# Patient Record
Sex: Male | Born: 1985
Health system: Southern US, Community
[De-identification: ages and names within clinical notes are randomized; demographics above are authoritative.]

## PROBLEM LIST (undated history)

## (undated) DIAGNOSIS — G4733 Obstructive sleep apnea (adult) (pediatric): Secondary | ICD-10-CM

## (undated) DIAGNOSIS — S83289A Other tear of lateral meniscus, current injury, unspecified knee, initial encounter: Secondary | ICD-10-CM

## (undated) DIAGNOSIS — M1990 Unspecified osteoarthritis, unspecified site: Secondary | ICD-10-CM

## (undated) DIAGNOSIS — K219 Gastro-esophageal reflux disease without esophagitis: Secondary | ICD-10-CM

## (undated) HISTORY — PX: LAPAROSCOPIC GASTRIC BAND REMOVAL WITH LAPAROSCOPIC GASTRIC SLEEVE RESECTION: SHX6498

---

## 2008-07-29 HISTORY — PX: WISDOM TOOTH EXTRACTION: SHX21

## 2015-02-27 ENCOUNTER — Encounter (HOSPITAL_BASED_OUTPATIENT_CLINIC_OR_DEPARTMENT_OTHER): Payer: Self-pay | Admitting: *Deleted

## 2015-02-27 ENCOUNTER — Emergency Department (HOSPITAL_BASED_OUTPATIENT_CLINIC_OR_DEPARTMENT_OTHER)
Admission: EM | Admit: 2015-02-27 | Discharge: 2015-02-27 | Disposition: A | Discharge status: Home / Self Care | Payer: Medicaid Other | Attending: Emergency Medicine | Admitting: Emergency Medicine

## 2015-02-27 DIAGNOSIS — H9201 Otalgia, right ear: Secondary | ICD-10-CM | POA: Diagnosis present

## 2015-02-27 DIAGNOSIS — R51 Headache: Secondary | ICD-10-CM | POA: Insufficient documentation

## 2015-02-27 DIAGNOSIS — H6091 Unspecified otitis externa, right ear: Secondary | ICD-10-CM | POA: Diagnosis not present

## 2015-02-27 MED ORDER — OXYCODONE-ACETAMINOPHEN 5-325 MG PO TABS
1.0000 | ORAL_TABLET | Freq: Once | ORAL | Status: AC
  Administered 2015-02-27: 1 via ORAL
  Filled 2015-02-27: qty 1

## 2015-02-27 MED ORDER — CIPROFLOXACIN-DEXAMETHASONE 0.3-0.1 % OT SUSP
4.0000 [drp] | Freq: Once | OTIC | Status: AC
  Administered 2015-02-27: 4 [drp] via OTIC
  Filled 2015-02-27: qty 7.5

## 2015-02-27 MED ORDER — IBUPROFEN 600 MG PO TABS: 600.0000 mg | ORAL_TABLET | Freq: Four times a day (QID) | ORAL | Status: DC | PRN

## 2015-02-27 NOTE — Discharge Instructions (Signed)
Apply ear drops, 4 drops twice a day for 7 days. Follow up as needed.    Otitis Externa Otitis externa is a bacterial or fungal infection of the outer ear canal. This is the area from the eardrum to the outside of the ear. Otitis externa is sometimes called "swimmer's ear." CAUSES  Possible causes of infection include:  Swimming in dirty water.  Moisture remaining in the ear after swimming or bathing.  Mild injury (trauma) to the ear.  Objects stuck in the ear (foreign body).  Cuts or scrapes (abrasions) on the outside of the ear. SIGNS AND SYMPTOMS  The first symptom of infection is often itching in the ear canal. Later signs and symptoms may include swelling and redness of the ear canal, ear pain, and yellowish-white fluid (pus) coming from the ear. The ear pain may be worse when pulling on the earlobe. DIAGNOSIS  Your health care provider will perform a physical exam. A sample of fluid may be taken from the ear and examined for bacteria or fungi. TREATMENT  Antibiotic ear drops are often given for 10 to 14 days. Treatment may also include pain medicine or corticosteroids to reduce itching and swelling. HOME CARE INSTRUCTIONS   Apply antibiotic ear drops to the ear canal as prescribed by your health care provider.  Take medicines only as directed by your health care provider.  If you have diabetes, follow any additional treatment instructions from your health care provider.  Keep all follow-up visits as directed by your health care provider. PREVENTION   Keep your ear dry. Use the corner of a towel to absorb water out of the ear canal after swimming or bathing.  Avoid scratching or putting objects inside your ear. This can damage the ear canal or remove the protective wax that lines the canal. This makes it easier for bacteria and fungi to grow.  Avoid swimming in lakes, polluted water, or poorly chlorinated pools.  You may use ear drops made of rubbing alcohol and vinegar  after swimming. Combine equal parts of white vinegar and alcohol in a bottle. Put 3 or 4 drops into each ear after swimming. SEEK MEDICAL CARE IF:   You have a fever.  Your ear is still red, swollen, painful, or draining pus after 3 days.  Your redness, swelling, or pain gets worse.  You have a severe headache.  You have redness, swelling, pain, or tenderness in the area behind your ear. MAKE SURE YOU:   Understand these instructions.  Will watch your condition.  Will get help right away if you are not doing well or get worse. Document Released: 07/15/2005 Document Revised: 11/29/2013 Document Reviewed: 08/01/2011 [facility] Patient Information 2015 [place], [place]. This information is not intended to replace advice given to you by your health care provider. Make sure you discuss any questions you have with your health care provider.

## 2015-02-27 NOTE — ED Notes (Signed)
PA at bedside.

## 2015-02-27 NOTE — ED Provider Notes (Signed)
CSN: [id number]     Arrival date & time 02/27/15  1805 History   First MD Initiated Contact with Patient 02/27/15 1854     Chief Complaint  Patient presents with  . Otalgia     (Consider location/radiation/quality/duration/timing/severity/associated sxs/prior Treatment) HPI Antonio Conner is a 29 y.o. male with no medical problems, presents to ED with right ear pain. Pt states pain started 4 days ago. Reports swimming in the lake a week ago. Denies any drainage. States ear pain worsened with palpation of the ear. States some decreased hearing. Denies fever, chills, malaise. No other complaints. No other URI symptoms.     History reviewed. No pertinent past medical history. History reviewed. No pertinent past surgical history. No family history on file. History  Substance Use Topics  . Smoking status: Never Smoker   . Smokeless tobacco: Not on file  . Alcohol Use: No    Review of Systems  Constitutional: Negative for fever and chills.  HENT: Positive for ear pain. Negative for congestion, ear discharge, facial swelling, hearing loss, mouth sores, sore throat and tinnitus.   Neurological: Positive for headaches.      Allergies  Influenza vaccines  Home Medications   Prior to Admission medications   Not on File   BP 129/86 mmHg  Pulse 82  Temp(Src) 98.1 F (36.7 C) (Oral)  Resp 20  Ht  (1.778 m)  Wt 460 lb (208.655 kg)  BMI 66.00 kg/m2  SpO2 99% Physical Exam  Constitutional: He appears well-developed and well-nourished. No distress.  HENT:  Nose: Nose normal.  Mouth/Throat: Oropharynx is clear and moist. No oropharyngeal exudate.  Swelling noted over helix and tragus of right ear. TTP. Ear canal swelling, drainage noted. No ttp over mastoid  Cardiovascular: Normal rate, regular rhythm and normal heart sounds.   Pulmonary/Chest: Effort normal and breath sounds normal. No respiratory distress. He has no wheezes. He has no rales.  Nursing note and vitals  reviewed.   ED Course  Procedures (including critical care time) Labs Review Labs Reviewed - No data to display  Imaging Review No results found.   EKG Interpretation None      MDM   Final diagnoses:  Otitis externa, right   Pt with right ear pain and swelling. Exam consistent with otitis externa. Wick inserted. Ciprodex given. Home with ciprodex, ibuprofen, follow up as needed. Return precautions dicussed.   Filed Vitals:   02/27/15 1809 02/27/15 2016  BP: 129/86 150/78  Pulse: 82 88  Temp: 98.1 F (36.7 C)   TempSrc: Oral   Resp: 20   Height:  (1.778 m)   Weight: 460 lb (208.655 kg)   SpO2: 99% 100%     Jaynie Crumble, PA-C 02/28/15 0020  Jerelyn Scott, MD 02/28/15 [phone]

## 2015-02-27 NOTE — ED Notes (Signed)
Was swimming [place] a lake last week and ocean a few days ago. Here for pain and swelling to his right ear.

## 2015-03-30 HISTORY — PX: LAPAROSCOPIC GASTRIC BAND REMOVAL WITH LAPAROSCOPIC GASTRIC SLEEVE RESECTION: SHX6498

## 2017-05-26 DIAGNOSIS — E119 Type 2 diabetes mellitus without complications: Secondary | ICD-10-CM | POA: Diagnosis not present

## 2017-05-26 DIAGNOSIS — R05 Cough: Secondary | ICD-10-CM | POA: Diagnosis not present

## 2017-05-26 DIAGNOSIS — I10 Essential (primary) hypertension: Secondary | ICD-10-CM | POA: Diagnosis not present

## 2017-08-29 DIAGNOSIS — J029 Acute pharyngitis, unspecified: Secondary | ICD-10-CM | POA: Diagnosis not present

## 2018-06-28 ENCOUNTER — Other Ambulatory Visit: Payer: Self-pay

## 2018-06-28 ENCOUNTER — Emergency Department (HOSPITAL_BASED_OUTPATIENT_CLINIC_OR_DEPARTMENT_OTHER)
Admission: EM | Admit: 2018-06-28 | Discharge: 2018-06-28 | Disposition: A | Discharge status: Home / Self Care | Payer: No Typology Code available for payment source | Attending: Emergency Medicine | Admitting: Emergency Medicine

## 2018-06-28 ENCOUNTER — Encounter (HOSPITAL_BASED_OUTPATIENT_CLINIC_OR_DEPARTMENT_OTHER): Payer: Self-pay | Admitting: *Deleted

## 2018-06-28 DIAGNOSIS — Z7721 Contact with and (suspected) exposure to potentially hazardous body fluids: Secondary | ICD-10-CM | POA: Diagnosis present

## 2018-06-28 DIAGNOSIS — Z9884 Bariatric surgery status: Secondary | ICD-10-CM | POA: Insufficient documentation

## 2018-06-28 DIAGNOSIS — Y990 Civilian activity done for income or pay: Secondary | ICD-10-CM | POA: Diagnosis not present

## 2018-06-28 LAB — HCV COMMENT:

## 2018-06-28 LAB — COMPREHENSIVE METABOLIC PANEL
ALT: 11 U/L (ref 0–44)
ANION GAP: 7 (ref 5–15)
AST: 18 U/L (ref 15–41)
Albumin: 4.2 g/dL (ref 3.5–5.0)
Alkaline Phosphatase: 66 U/L (ref 38–126)
BUN: 13 mg/dL (ref 6–20)
CHLORIDE: 106 mmol/L (ref 98–111)
CO2: 26 mmol/L (ref 22–32)
Calcium: 8.8 mg/dL — ABNORMAL LOW (ref 8.9–10.3)
Creatinine, Ser: 1.03 mg/dL (ref 0.61–1.24)
GFR calc Af Amer: >60 mL/min (ref >60)
Glucose, Bld: 104 mg/dL — ABNORMAL HIGH (ref 70–99)
Potassium: 3.6 mmol/L (ref 3.5–5.1)
SODIUM: 139 mmol/L (ref 135–145)
Total Bilirubin: 0.1 mg/dL — ABNORMAL LOW (ref 0.3–1.2)
Total Protein: 7.4 g/dL (ref 6.5–8.1)

## 2018-06-28 LAB — HEPATITIS B SURFACE ANTIGEN: HEP B S AG: NEGATIVE

## 2018-06-28 LAB — HEPATITIS C ANTIBODY (REFLEX)

## 2018-06-28 LAB — RAPID HIV SCREEN (HIV 1/2 AB+AG)
HIV 1/2 ANTIBODIES: NONREACTIVE
HIV-1 P24 ANTIGEN - HIV24: NONREACTIVE

## 2018-06-28 MED ORDER — ELVITEG-COBIC-EMTRICIT-TENOFAF 150-150-200-10 MG PREPACK
5.0000 | ORAL_TABLET | Freq: Once | ORAL | Status: AC
  Administered 2018-06-28: 5 via ORAL
  Filled 2018-06-28: qty 5

## 2018-06-28 MED ORDER — ELVITEG-COBIC-EMTRICIT-TENOFAF 150-150-200-10 MG PO TABS: 1.0000 | ORAL_TABLET | Freq: Every day | ORAL | 0 refills | Status: DC

## 2018-06-28 MED ORDER — ELVITEG-COBIC-EMTRICIT-TENOFAF 150-150-200-10 MG PO TABS
ORAL_TABLET | ORAL | Status: AC
  Filled 2018-06-28: qty 5
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
## 2019-05-27 NOTE — Patient Instructions (Addendum)
DUE TO COVID-19 ONLY ONE VISITOR IS ALLOWED TO COME WITH YOU AND STAY IN THE WAITING ROOM ONLY DURING PRE OP AND PROCEDURE DAY OF SURGERY. THE 1 VISITOR MAY VISIT WITH YOU AFTER SURGERY IN YOUR PRIVATE ROOM DURING VISITING HOURS ONLY!  YOU NEED TO HAVE A COVID 19 TEST ON___Friday 10/30/2020____ @__1015  am_____, THIS TEST MUST BE DONE BEFORE SURGERY, COME  [address], [place] , [address].  ([place]) ONCE YOUR COVID TEST IS COMPLETED, PLEASE BEGIN THE QUARANTINE INSTRUCTIONS AS OUTLINED IN YOUR HANDOUT.                Antonio Conner    Your procedure is scheduled on: Monday 05/31/2019   Report to [facility] Main  Entrance    Report to admitting at   135 PM     Call this number if you have problems the morning of surgery [phone]    Remember: Do not eat food  :After Midnight.   NO SOLID FOOD AFTER MIDNIGHT THE NIGHT PRIOR TO SURGERY. NOTHING BY MOUTH EXCEPT CLEAR LIQUIDS UNTIL  1235 pm .     PLEASE FINISH ENSURE DRINK PER SURGEON ORDER  WHICH NEEDS TO BE COMPLETED AT   1235 pm.   CLEAR LIQUID DIET   Foods Allowed                                                                     Foods Excluded  Coffee and tea, regular and decaf                             liquids that you cannot  Plain Jell-O any favor except red or purple                                           see through such as: Fruit ices (not with fruit pulp)                                     milk, soups, orange juice  Iced Popsicles                                    All solid food Carbonated beverages, regular and diet                                    Cranberry, grape and apple juices Sports drinks like Gatorade Lightly seasoned clear broth or consume(fat free) Sugar, honey syrup  Sample Menu Breakfast                                Lunch  Supper Cranberry juice                    Beef broth                            Chicken  broth Jell-O                                     Grape juice                           Apple juice Coffee or tea                        Jell-O                                      Popsicle                                                Coffee or tea                        Coffee or tea  _____________________________________________________________________      BRUSH YOUR TEETH MORNING OF SURGERY AND RINSE YOUR MOUTH OUT, NO CHEWING GUM CANDY OR MINTS.     Take these medicines the morning of surgery with A SIP OF WATER: none                                 You may not have any metal on your body including hair pins and              piercings  Do not wear jewelry, make-up, lotions, powders or perfumes, deodorant           .              Men may shave face and neck.   Do not bring valuables to the hospital.  IS NOT             RESPONSIBLE   FOR VALUABLES.  Contacts, dentures or bridgework may not be worn into surgery.  Leave suitcase in the car. After surgery it may be brought to your room.     Patients discharged the day of surgery will not be allowed to drive home. IF YOU ARE HAVING SURGERY AND GOING HOME THE SAME DAY, YOU MUST HAVE AN ADULT TO DRIVE YOU HOME AND BE WITH YOU FOR 24 HOURS. YOU MAY GO HOME BY TAXI OR UBER OR ORTHERWISE, BUT AN ADULT MUST ACCOMPANY YOU HOME AND STAY WITH YOU FOR 24 HOURS.  Name and phone number of your driver: fiancee'-[name]   Cell-[phone]               Please read over the following fact sheets you were given: _____________________________________________________________________             [facility] - Preparing for Surgery Before surgery, you can play an important role.  Because skin is not sterile, your skin needs to  be as free of germs as possible.  You can reduce the number of germs on your skin by washing with CHG (chlorahexidine gluconate) soap before surgery.  CHG is an antiseptic cleaner which kills germs and bonds with  the skin to continue killing germs even after washing. Please DO NOT use if you have an allergy to CHG or antibacterial soaps.  If your skin becomes reddened/irritated stop using the CHG and inform your nurse when you arrive at Short Stay. Do not shave (including legs and underarms) for at least 48 hours prior to the first CHG shower.  You may shave your face/neck. Please follow these instructions carefully:  1.  Shower with CHG Soap the night before surgery and the  morning of Surgery.  2.  If you choose to wash your hair, wash your hair first as usual with your  normal  shampoo.  3.  After you shampoo, rinse your hair and body thoroughly to remove the  shampoo.                           4.  Use CHG as you would any other liquid soap.  You can apply chg directly  to the skin and wash                       Gently with a scrungie or clean washcloth.  5.  Apply the CHG Soap to your body ONLY FROM THE NECK DOWN.   Do not use on face/ open                           Wound or open sores. Avoid contact with eyes, ears mouth and genitals (private parts).                       Wash face,  Genitals (private parts) with your normal soap.             6.  Wash thoroughly, paying special attention to the area where your surgery  will be performed.  7.  Thoroughly rinse your body with warm water from the neck down.  8.  DO NOT shower/wash with your normal soap after using and rinsing off  the CHG Soap.                9.  Pat yourself dry with a clean towel.            10.  Wear clean pajamas.            11.  Place clean sheets on your bed the night of your first shower and do not  sleep with pets. Day of Surgery : Do not apply any lotions/deodorants the morning of surgery.  Please wear clean clothes to the hospital/surgery center.  FAILURE TO FOLLOW THESE INSTRUCTIONS MAY RESULT IN THE CANCELLATION OF YOUR SURGERY PATIENT SIGNATURE_________________________________  NURSE  SIGNATURE__________________________________  ________________________________________________________________________   Adam Phenix  An incentive spirometer is a tool that can help keep your lungs clear and active. This tool measures how well you are filling your lungs with each breath. Taking long deep breaths may help reverse or decrease the chance of developing breathing (pulmonary) problems (especially infection) following:  A long period of time when you are unable to move or be active. BEFORE THE PROCEDURE   If the spirometer includes an indicator to show your  best effort, your nurse or respiratory therapist will set it to a desired goal.  If possible, sit up straight or lean slightly forward. Try not to slouch.  Hold the incentive spirometer in an upright position. INSTRUCTIONS FOR USE  1. Sit on the edge of your bed if possible, or sit up as far as you can in bed or on a chair. 2. Hold the incentive spirometer in an upright position. 3. Breathe out normally. 4. Place the mouthpiece in your mouth and seal your lips tightly around it. 5. Breathe in slowly and as deeply as possible, raising the piston or the ball toward the top of the column. 6. Hold your breath for 3-5 seconds or for as long as possible. Allow the piston or ball to fall to the bottom of the column. 7. Remove the mouthpiece from your mouth and breathe out normally. 8. Rest for a few seconds and repeat Steps 1 through 7 at least 10 times every 1-2 hours when you are awake. Take your time and take a few normal breaths between deep breaths. 9. The spirometer may include an indicator to show your best effort. Use the indicator as a goal to work toward during each repetition. 10. After each set of 10 deep breaths, practice coughing to be sure your lungs are clear. If you have an incision (the cut made at the time of surgery), support your incision when coughing by placing a pillow or rolled up towels firmly  against it. Once you are able to get out of bed, walk around indoors and cough well. You may stop using the incentive spirometer when instructed by your caregiver.  RISKS AND COMPLICATIONS  Take your time so you do not get dizzy or light-headed.  If you are in pain, you may need to take or ask for pain medication before doing incentive spirometry. It is harder to take a deep breath if you are having pain. AFTER USE  Rest and breathe slowly and easily.  It can be helpful to keep track of a log of your progress. Your caregiver can provide you with a simple table to help with this. If you are using the spirometer at home, follow these instructions: Antonio Conner IF:   You are having difficultly using the spirometer.  You have trouble using the spirometer as often as instructed.  Your pain medication is not giving enough relief while using the spirometer.  You develop fever of 100.5 F (38.1 C) or higher. SEEK IMMEDIATE MEDICAL CARE IF:   You cough up bloody sputum that had not been present before.  You develop fever of 102 F (38.9 C) or greater.  You develop worsening pain at or near the incision site. MAKE SURE YOU:   Understand these instructions.  Will watch your condition.  Will get help right away if you are not doing well or get worse. Document Released: 11/25/2006 Document Revised: 10/07/2011 Document Reviewed: 01/26/2007 [facility] Patient Information 2014 [place], [place].   ________________________________________________________________________

## 2019-05-28 ENCOUNTER — Encounter (HOSPITAL_COMMUNITY)
Admission: RE | Admit: 2019-05-28 | Discharge: 2019-05-28 | Disposition: A | Discharge status: Home / Self Care | Payer: No Typology Code available for payment source | Source: Ambulatory Visit | Attending: Orthopedic Surgery | Admitting: Orthopedic Surgery

## 2019-05-28 ENCOUNTER — Other Ambulatory Visit: Payer: Self-pay

## 2019-05-28 ENCOUNTER — Other Ambulatory Visit (HOSPITAL_COMMUNITY)
Admission: RE | Admit: 2019-05-28 | Discharge: 2019-05-28 | Disposition: A | Discharge status: Home / Self Care | Payer: Worker's Compensation | Source: Ambulatory Visit | Attending: Orthopedic Surgery | Admitting: Orthopedic Surgery

## 2019-05-28 ENCOUNTER — Encounter (HOSPITAL_COMMUNITY): Payer: Self-pay

## 2019-05-28 DIAGNOSIS — Z20828 Contact with and (suspected) exposure to other viral communicable diseases: Secondary | ICD-10-CM | POA: Diagnosis not present

## 2019-05-28 DIAGNOSIS — S83281A Other tear of lateral meniscus, current injury, right knee, initial encounter: Secondary | ICD-10-CM | POA: Insufficient documentation

## 2019-05-28 DIAGNOSIS — Z01812 Encounter for preprocedural laboratory examination: Secondary | ICD-10-CM | POA: Insufficient documentation

## 2019-05-28 HISTORY — DX: Other tear of lateral meniscus, current injury, unspecified knee, initial encounter: S83.289A

## 2019-05-28 LAB — CBC WITH DIFFERENTIAL/PLATELET
Abs Immature Granulocytes: 0.02 10*3/uL (ref 0.00–0.07)
Basophils Absolute: 0 10*3/uL (ref 0.0–0.1)
Basophils Relative: 0 %
Eosinophils Absolute: 0.3 10*3/uL (ref 0.0–0.5)
Eosinophils Relative: 4 %
HCT: 46.2 % (ref 39.0–52.0)
Hemoglobin: 14.6 g/dL (ref 13.0–17.0)
Immature Granulocytes: 0 %
Lymphocytes Relative: 32 %
Lymphs Abs: 2.9 10*3/uL (ref 0.7–4.0)
MCH: 27.3 pg (ref 26.0–34.0)
MCHC: 31.6 g/dL (ref 30.0–36.0)
MCV: 86.5 fL (ref 80.0–100.0)
Monocytes Absolute: 0.8 10*3/uL (ref 0.1–1.0)
Monocytes Relative: 9 %
Neutro Abs: 5 10*3/uL (ref 1.7–7.7)
Neutrophils Relative %: 55 %
Platelets: 261 10*3/uL (ref 150–400)
RBC: 5.34 MIL/uL (ref 4.22–5.81)
RDW: 15.5 % (ref 11.5–15.5)
WBC: 9.1 10*3/uL (ref 4.0–10.5)
nRBC: 0 % (ref 0.0–0.2)

## 2019-05-28 LAB — COMPREHENSIVE METABOLIC PANEL
ALT: 15 U/L (ref 0–44)
AST: 20 U/L (ref 15–41)
Albumin: 4 g/dL (ref 3.5–5.0)
Alkaline Phosphatase: 70 U/L (ref 38–126)
Anion gap: 8 (ref 5–15)
BUN: 14 mg/dL (ref 6–20)
CO2: 26 mmol/L (ref 22–32)
Calcium: 9.1 mg/dL (ref 8.9–10.3)
Chloride: 105 mmol/L (ref 98–111)
Creatinine, Ser: 1.02 mg/dL (ref 0.61–1.24)
GFR calc Af Amer: >60 mL/min (ref >60)
GFR calc non Af Amer: >60 mL/min (ref >60)
Glucose, Bld: 125 mg/dL — ABNORMAL HIGH (ref 70–99)
Potassium: 4.2 mmol/L (ref 3.5–5.1)
Sodium: 139 mmol/L (ref 135–145)
Total Bilirubin: 0.8 mg/dL (ref 0.3–1.2)
Total Protein: 7.9 g/dL (ref 6.5–8.1)

## 2019-05-28 NOTE — Progress Notes (Signed)
Patient called and informed of time change for surgery. Patient instructed to arrive at Admitting at [facility] 1040 am on Monday 05/31/2019. Patient instructed that he can have clear liquids from midnight up until he  Drinks the Ensure drink  at 0940 am and then nothing else until after surgery. Patient verbalized understanding.

## 2019-05-28 NOTE — Progress Notes (Addendum)
PCP - Dr. Iona Beard Osei-Bonsu Cardiologist - saw 04/05/2015 for work-up to get Gastric sleeve surgery- Dr. Abran Richard   Surgeon for Gastric sleeve-Dr. Ralph Leyden   All in Epic and care everywhere  Chest x-ray - no EKG - no Stress Test - no ECHO - 04/21/2015- in care everywhere [facility]-had to have before gastric sleeve surgery Cardiac Cath - no  Sleep Study - had one 5 years ago-used CPAP-lost insurance so has not used CPAP since then CPAP - quit using 5 years ago  Fasting Blood Sugar - no Checks Blood Sugar _____ times a day  Blood Thinner Instructions:no Aspirin Instructions:no Last Dose:  Anesthesia review:  Chart given to [place], [place] to review.  Patient denies shortness of breath, fever, cough and chest pain at PAT appointment   Patient verbalized understanding of instructions that were given to them at the PAT appointment. Patient was also instructed that they will need to review over the PAT instructions again at home before surgery.

## 2019-05-29 LAB — NOVEL CORONAVIRUS, NAA (HOSP ORDER, SEND-OUT TO REF LAB; TAT 18-24 HRS): SARS-CoV-2, NAA: NOT DETECTED

## 2019-05-30 MED ORDER — DEXTROSE 5 % IV SOLN
3.0000 g | INTRAVENOUS | Status: AC
  Administered 2019-05-31: 3 g via INTRAVENOUS
  Filled 2019-05-30: qty 3

## 2019-05-31 ENCOUNTER — Ambulatory Visit (HOSPITAL_COMMUNITY)
Admission: RE | Admit: 2019-05-31 | Discharge: 2019-05-31 | Disposition: A | Discharge status: Home / Self Care | Payer: No Typology Code available for payment source | Source: Ambulatory Visit | Attending: Orthopedic Surgery | Admitting: Orthopedic Surgery

## 2019-05-31 ENCOUNTER — Ambulatory Visit (HOSPITAL_COMMUNITY): Payer: No Typology Code available for payment source | Admitting: Anesthesiology

## 2019-05-31 ENCOUNTER — Encounter (HOSPITAL_COMMUNITY)
Admission: RE | Disposition: A | Discharge status: Home / Self Care | Payer: Self-pay | Source: Ambulatory Visit | Attending: Orthopedic Surgery

## 2019-05-31 ENCOUNTER — Other Ambulatory Visit: Payer: Self-pay

## 2019-05-31 ENCOUNTER — Encounter (HOSPITAL_COMMUNITY): Payer: Self-pay | Admitting: *Deleted

## 2019-05-31 DIAGNOSIS — W010XXA Fall on same level from slipping, tripping and stumbling without subsequent striking against object, initial encounter: Secondary | ICD-10-CM | POA: Diagnosis not present

## 2019-05-31 DIAGNOSIS — S83281A Other tear of lateral meniscus, current injury, right knee, initial encounter: Secondary | ICD-10-CM | POA: Insufficient documentation

## 2019-05-31 DIAGNOSIS — G4730 Sleep apnea, unspecified: Secondary | ICD-10-CM | POA: Insufficient documentation

## 2019-05-31 DIAGNOSIS — Y990 Civilian activity done for income or pay: Secondary | ICD-10-CM | POA: Diagnosis not present

## 2019-05-31 DIAGNOSIS — Z9884 Bariatric surgery status: Secondary | ICD-10-CM | POA: Insufficient documentation

## 2019-05-31 HISTORY — PX: KNEE ARTHROSCOPY: SHX127

## 2019-05-31 SURGERY — ARTHROSCOPY, KNEE
Anesthesia: General | Site: Knee | Laterality: Right

## 2019-05-31 MED ORDER — HYDROMORPHONE HCL 1 MG/ML IJ SOLN
INTRAMUSCULAR | Status: AC
  Filled 2019-05-31: qty 1

## 2019-05-31 MED ORDER — LIDOCAINE 2% (20 MG/ML) 5 ML SYRINGE
INTRAMUSCULAR | Status: AC
  Filled 2019-05-31: qty 5

## 2019-05-31 MED ORDER — ACETAMINOPHEN 500 MG PO TABS
1000.0000 mg | ORAL_TABLET | Freq: Once | ORAL | Status: AC
  Administered 2019-05-31: 1000 mg via ORAL
  Filled 2019-05-31: qty 2

## 2019-05-31 MED ORDER — LACTATED RINGERS IR SOLN
Status: DC | PRN
  Administered 2019-05-31: 12000 mL

## 2019-05-31 MED ORDER — BUPIVACAINE-EPINEPHRINE 0.25% -1:200000 IJ SOLN
INTRAMUSCULAR | Status: DC | PRN
  Administered 2019-05-31: 20 mL

## 2019-05-31 MED ORDER — OXYCODONE HCL 5 MG/5ML PO SOLN: 5.0000 mg | Freq: Once | ORAL | Status: DC | PRN

## 2019-05-31 MED ORDER — PROMETHAZINE HCL 25 MG/ML IJ SOLN: 6.2500 mg | INTRAMUSCULAR | Status: DC | PRN

## 2019-05-31 MED ORDER — BUPIVACAINE HCL (PF) 0.25 % IJ SOLN
INTRAMUSCULAR | Status: AC
  Filled 2019-05-31: qty 30

## 2019-05-31 MED ORDER — MEPERIDINE HCL 50 MG/ML IJ SOLN: 6.2500 mg | INTRAMUSCULAR | Status: DC | PRN

## 2019-05-31 MED ORDER — ONDANSETRON HCL 4 MG/2ML IJ SOLN
INTRAMUSCULAR | Status: DC | PRN
  Administered 2019-05-31: 4 mg via INTRAVENOUS

## 2019-05-31 MED ORDER — FENTANYL CITRATE (PF) 100 MCG/2ML IJ SOLN
INTRAMUSCULAR | Status: DC | PRN
  Administered 2019-05-31 (×2): 100 ug via INTRAVENOUS

## 2019-05-31 MED ORDER — OXYCODONE HCL 5 MG PO TABS: 5.0000 mg | ORAL_TABLET | Freq: Once | ORAL | Status: DC | PRN

## 2019-05-31 MED ORDER — MIDAZOLAM HCL 2 MG/2ML IJ SOLN
INTRAMUSCULAR | Status: AC
  Filled 2019-05-31: qty 2

## 2019-05-31 MED ORDER — ROCURONIUM BROMIDE 10 MG/ML (PF) SYRINGE
PREFILLED_SYRINGE | INTRAVENOUS | Status: DC | PRN
  Administered 2019-05-31: 40 mg via INTRAVENOUS

## 2019-05-31 MED ORDER — FENTANYL CITRATE (PF) 100 MCG/2ML IJ SOLN
INTRAMUSCULAR | Status: AC
  Filled 2019-05-31: qty 2

## 2019-05-31 MED ORDER — HYDROMORPHONE HCL 1 MG/ML IJ SOLN: 0.2500 mg | INTRAMUSCULAR | Status: DC | PRN

## 2019-05-31 MED ORDER — PROPOFOL 10 MG/ML IV BOLUS
INTRAVENOUS | Status: DC | PRN
  Administered 2019-05-31: 200 mg via INTRAVENOUS

## 2019-05-31 MED ORDER — 0.9 % SODIUM CHLORIDE (POUR BTL) OPTIME
TOPICAL | Status: DC | PRN
  Administered 2019-05-31: 1000 mL

## 2019-05-31 MED ORDER — DEXAMETHASONE SODIUM PHOSPHATE 10 MG/ML IJ SOLN
INTRAMUSCULAR | Status: AC
  Filled 2019-05-31: qty 1

## 2019-05-31 MED ORDER — SUCCINYLCHOLINE CHLORIDE 200 MG/10ML IV SOSY
PREFILLED_SYRINGE | INTRAVENOUS | Status: DC | PRN
  Administered 2019-05-31: 180 mg via INTRAVENOUS

## 2019-05-31 MED ORDER — LIDOCAINE HCL (CARDIAC) PF 100 MG/5ML IV SOSY
PREFILLED_SYRINGE | INTRAVENOUS | Status: DC | PRN
  Administered 2019-05-31: 100 mg via INTRAVENOUS

## 2019-05-31 MED ORDER — HYDROCODONE-ACETAMINOPHEN 5-325 MG PO TABS: 1.0000 | ORAL_TABLET | Freq: Four times a day (QID) | ORAL | 0 refills | Status: DC | PRN

## 2019-05-31 MED ORDER — KETOROLAC TROMETHAMINE 30 MG/ML IJ SOLN
30.0000 mg | Freq: Once | INTRAMUSCULAR | Status: AC | PRN
  Administered 2019-05-31: 30 mg via INTRAVENOUS

## 2019-05-31 MED ORDER — PROPOFOL 10 MG/ML IV BOLUS
INTRAVENOUS | Status: AC
  Filled 2019-05-31: qty 40

## 2019-05-31 MED ORDER — MIDAZOLAM HCL 2 MG/2ML IJ SOLN
INTRAMUSCULAR | Status: DC | PRN
  Administered 2019-05-31: 2 mg via INTRAVENOUS

## 2019-05-31 MED ORDER — DEXAMETHASONE SODIUM PHOSPHATE 10 MG/ML IJ SOLN
INTRAMUSCULAR | Status: DC | PRN
  Administered 2019-05-31: 7 mg via INTRAVENOUS

## 2019-05-31 MED ORDER — LACTATED RINGERS IV SOLN
INTRAVENOUS | Status: DC
  Administered 2019-05-31 (×2): via INTRAVENOUS

## 2019-05-31 MED ORDER — METHOCARBAMOL 500 MG PO TABS: 500.0000 mg | ORAL_TABLET | Freq: Four times a day (QID) | ORAL | 1 refills | Status: DC

## 2019-05-31 MED ORDER — CHLORHEXIDINE GLUCONATE 4 % EX LIQD: 60.0000 mL | Freq: Once | CUTANEOUS | Status: DC

## 2019-05-31 MED ORDER — ONDANSETRON HCL 4 MG/2ML IJ SOLN
INTRAMUSCULAR | Status: AC
  Filled 2019-05-31: qty 2

## 2019-05-31 MED ORDER — BUPIVACAINE-EPINEPHRINE 0.25% -1:200000 IJ SOLN
INTRAMUSCULAR | Status: AC
  Filled 2019-05-31: qty 1

## 2019-05-31 MED ORDER — SUGAMMADEX SODIUM 200 MG/2ML IV SOLN
INTRAVENOUS | Status: DC | PRN
  Administered 2019-05-31: 370 mg via INTRAVENOUS

## 2019-05-31 MED ORDER — KETOROLAC TROMETHAMINE 30 MG/ML IJ SOLN
INTRAMUSCULAR | Status: AC
  Filled 2019-05-31: qty 1

## 2019-05-31 SURGICAL SUPPLY — 30 items
BNDG ELASTIC 6X5.8 VLCR STR LF (GAUZE/BANDAGES/DRESSINGS) ×2 IMPLANT
COVER WAND RF STERILE (DRAPES) IMPLANT
DISSECTOR 4.0MM X 13CM (MISCELLANEOUS) ×2 IMPLANT
DRAPE U-SHAPE 47X51 STRL (DRAPES) ×2 IMPLANT
DRSG EMULSION OIL 3X3 NADH (GAUZE/BANDAGES/DRESSINGS) ×2 IMPLANT
DRSG PAD ABDOMINAL 8X10 ST (GAUZE/BANDAGES/DRESSINGS) ×2 IMPLANT
DURAPREP 26ML APPLICATOR (WOUND CARE) ×2 IMPLANT
GAUZE 4X4 16PLY RFD (DISPOSABLE) ×2 IMPLANT
GAUZE SPONGE 4X4 12PLY STRL (GAUZE/BANDAGES/DRESSINGS) ×2 IMPLANT
GLOVE BIO SURGEON STRL SZ8 (GLOVE) ×2 IMPLANT
GLOVE BIOGEL PI IND STRL 7.0 (GLOVE) ×1 IMPLANT
GLOVE BIOGEL PI IND STRL 8 (GLOVE) ×1 IMPLANT
GLOVE BIOGEL PI INDICATOR 7.0 (GLOVE) ×1
GLOVE BIOGEL PI INDICATOR 8 (GLOVE) ×1
GLOVE SURG SS PI 7.0 STRL IVOR (GLOVE) ×2 IMPLANT
GOWN STRL REUS W/TWL LRG LVL3 (GOWN DISPOSABLE) ×4 IMPLANT
KIT BASIN OR (CUSTOM PROCEDURE TRAY) ×2 IMPLANT
KIT TURNOVER KIT A (KITS) IMPLANT
MANIFOLD NEPTUNE II (INSTRUMENTS) ×2 IMPLANT
MARKER SKIN DUAL TIP RULER LAB (MISCELLANEOUS) ×2 IMPLANT
PACK ARTHROSCOPY WL (CUSTOM PROCEDURE TRAY) ×2 IMPLANT
PACK ICE MAXI GEL EZY WRAP (MISCELLANEOUS) ×6 IMPLANT
PADDING CAST COTTON 6X4 STRL (CAST SUPPLIES) ×4 IMPLANT
PORT APPOLLO RF 90DEGREE MULTI (SURGICAL WAND) ×2 IMPLANT
PROTECTOR NERVE ULNAR (MISCELLANEOUS) ×2 IMPLANT
SUT ETHILON 4 0 PS 2 18 (SUTURE) ×2 IMPLANT
TOWEL OR 17X26 10 PK STRL BLUE (TOWEL DISPOSABLE) ×2 IMPLANT
TUBING ARTHRO INFLOW-ONLY STRL (TUBING) ×2 IMPLANT
TUBING ARTHROSCOPY IRRIG 16FT (MISCELLANEOUS) ×2 IMPLANT
WRAP KNEE MAXI GEL POST OP (GAUZE/BANDAGES/DRESSINGS) ×2 IMPLANT

## 2019-05-31 NOTE — Op Note (Addendum)
Operative Report- KNEE ARTHROSCOPY  Preoperative diagnosis-  Right knee lateral meniscal tear  Postoperative diagnosis Right- knee lateral meniscal tear   Procedure- Right knee arthroscopy with lateral  meniscal debridement    Surgeon- Dione Plover. Jaimin Krupka, MD  Anesthesia-General  EBL-  Minimal  Complications- None  Condition- PACU - hemodynamically stable.  Brief clinical note- -Antonio Conner is a 33 y.o.  male with a several month history of Right knee pain and mechanical symptoms after a workplace injury. Exam and history suggested lateral  meniscal tear confirmed by MRI. The patient presents now for arthroscopy and debridement   Procedure in detail -       After successful administration of General anesthetic,the Right lower extremity is prepped and draped in the usual sterile fashion. Time out is performed by the surgical team. Standard superomedial and inferolateral portal sites are marked and incisions made with an 11 blade. The inflow cannula is passed through the superomedial portal and camera through the inferolateral portal and inflow is initiated. Arthroscopic visualization proceeds.      The undersurface of the patella and trochlea are visualized and there is mild chondromalacia but no chondral defects. The medial and lateral gutters are visualized and there are  no loose bodies. Flexion and valgus force is applied to the knee and the medial compartment is entered. A spinal needle is passed into the joint through the site marked for the inferomedial portal. A small incision is made and the dilator passed into the joint. The findings for the medial compartment are normal .     The intercondylar notch is visualized and the ACL appears normal . The lateral compartment is entered and the findings are unstable tear of lateral meniscus body and posterior horn . The tear is debrided to a stable base with baskets and a shaver and sealed off with the Arthrocare.  It is probed and found  to be stable. There were no articular cartilagel abnormalities     The joint is again inspected and there are no other tears, defects or loose bodies identified. The arthroscopic equipment is then removed from the inferior portals which are closed with interrupted 4-0 nylon. 20 ml of .25% Marcaine with epinephrine are injected through the inflow cannula and the cannula is then removed and the portal closed with nylon. The incisions are cleaned and dried and a bulky sterile dressing is applied. The patient is then awakened and transported to recovery in stable condition.   05/31/2019, 1:56 PM

## 2019-05-31 NOTE — Anesthesia Preprocedure Evaluation (Addendum)
Anesthesia Evaluation  Patient identified by MRN, date of birth, ID band Patient awake    Reviewed: Allergy & Precautions, NPO status , Patient's Chart, lab work & pertinent test results  Airway Mallampati: II  TM Distance: >3 FB Neck ROM: Full    Dental  (+) Teeth Intact, Dental Advisory Given   Pulmonary sleep apnea ,  Lost insurance about 5 years ago, at which time he stopped using CPAP   Pulmonary exam normal breath sounds clear to auscultation       Cardiovascular negative cardio ROS Normal cardiovascular exam Rhythm:Regular Rate:Normal  Normal cardiac w/u in 2016 prior to bariatric surgery   Neuro/Psych negative neurological ROS  negative psych ROS   GI/Hepatic Neg liver ROS, S/p bariatric surgery 2016   Endo/Other  Morbid obesityBMI 60  Renal/GU negative Renal ROS  negative genitourinary   Musculoskeletal negative musculoskeletal ROS (+)   Abdominal (+) + obese,   Peds negative pediatric ROS (+)  Hematology negative hematology ROS (+)   Anesthesia Other Findings   Reproductive/Obstetrics negative OB ROS                            Anesthesia Physical Anesthesia Plan  ASA: III  Anesthesia Plan: General   Post-op Pain Management:    Induction: Intravenous  PONV Risk Score and Plan: 2 and Ondansetron, Dexamethasone, Treatment may vary due to age or medical condition and Midazolam  Airway Management Planned: Oral ETT  Additional Equipment: None  Intra-op Plan:   Post-operative Plan: Extubation in OR  Informed Consent: I have reviewed the patients History and Physical, chart, labs and discussed the procedure including the risks, benefits and alternatives for the proposed anesthesia with the patient or authorized representative who has indicated his/her understanding and acceptance.     Dental advisory given  Plan Discussed with: CRNA  Anesthesia Plan Comments:         Anesthesia Quick Evaluation

## 2019-05-31 NOTE — Anesthesia Procedure Notes (Signed)
Procedure Name: Intubation Date/Time: 05/31/2019 12:36 PM Performed by: Raenette Rover, CRNA Pre-anesthesia Checklist: Patient identified, Emergency Drugs available, Suction available and Patient being monitored Patient Re-evaluated:Patient Re-evaluated prior to induction Oxygen Delivery Method: Circle system utilized Preoxygenation: Pre-oxygenation with 100% oxygen Induction Type: IV induction Ventilation: Mask ventilation without difficulty Laryngoscope Size: Miller and 3 Grade View: Grade I Tube type: Oral Tube size: 8.0 mm Number of attempts: 1 Airway Equipment and Method: Stylet and Patient positioned with wedge pillow Placement Confirmation: ETT inserted through vocal cords under direct vision,  positive ETCO2 and breath sounds checked- equal and bilateral Secured at: 24 cm Tube secured with: Tape Dental Injury: Teeth and Oropharynx as per pre-operative assessment

## 2019-05-31 NOTE — Anesthesia Postprocedure Evaluation (Signed)
Anesthesia Post Note  Patient: Antonio Conner  Procedure(s) Performed: Right knee arthroscopy with lateral meniscal debridement (Right Knee)     Patient location during evaluation: PACU Anesthesia Type: General Level of consciousness: awake and alert, oriented and patient cooperative Pain management: pain level controlled Vital Signs Assessment: post-procedure vital signs reviewed and stable Respiratory status: spontaneous breathing, nonlabored ventilation and respiratory function stable Cardiovascular status: blood pressure returned to baseline and stable Postop Assessment: no apparent nausea or vomiting Anesthetic complications: no    Last Vitals:  Vitals:   05/31/19 1400 05/31/19 1415  BP: (!) 144/100 (!) 135/108  Pulse: 88 88  Resp: 17 (!) 21  Temp:    SpO2: 98% 100%    Last Pain:  Vitals:   05/31/19 1400  TempSrc:   PainSc: [place]

## 2019-05-31 NOTE — Transfer of Care (Signed)
Immediate Anesthesia Transfer of Care Note  Patient: Antonio Conner  Procedure(s) Performed: Right knee arthroscopy with lateral meniscal debridement (Right Knee)  Patient Location: PACU  Anesthesia Type:General  Level of Consciousness: awake, alert , oriented and patient cooperative  Airway & Oxygen Therapy: Patient Spontanous Breathing and Patient connected to face mask oxygen  Post-op Assessment: Report given to RN and Post -op Vital signs reviewed and stable  Post vital signs: Reviewed and stable  Last Vitals:  Vitals Value Taken Time  BP 140/96 05/31/19 1354  Temp    Pulse 92 05/31/19 1357  Resp 18 05/31/19 1357  SpO2 98 % 05/31/19 1357  Vitals shown include unvalidated device data.  Last Pain:  Vitals:   05/31/19 1042  TempSrc: Oral      Patients Stated Pain Goal: 4 (64/33/29 5188)  Complications: No apparent anesthesia complications

## 2019-05-31 NOTE — H&P (Signed)
CC- Antonio Conner is a 33 y.o. male who presents with right  knee pain.  HPI- . Knee Pain: Patient presents with knee pain involving the  right knee. Onset of the symptoms was several months ago. Inciting event: injured while slipping at work. Current symptoms include giving out, pain located laterally, popping sensation, stiffness and swelling. Pain is aggravated by lateral movements, pivoting, rising after sitting and walking.  Patient has had no prior knee problems. Evaluation to date: MRI: abnormal lateral meniscal tear. Treatment to date: brace which is not very effective.  Past Medical History:  Diagnosis Date  . Lateral meniscus tear right knee    Past Surgical History:  Procedure Laterality Date  . LAPAROSCOPIC GASTRIC BAND REMOVAL WITH LAPAROSCOPIC GASTRIC SLEEVE RESECTION  03/2015  . WISDOM TOOTH EXTRACTION  2010    Prior to Admission medications   Medication Sig Start Date End Date Taking? Authorizing Provider  acetaminophen (TYLENOL) 325 MG tablet Take 975 mg by mouth every 6 (six) hours as needed for mild pain or headache.   Yes [provider]  Ascorbic Acid (VITAMIN C PO) Take 1 tablet by mouth daily.   Yes [provider]  Multiple Vitamin (MULTIVITAMIN WITH MINERALS) TABS tablet Take 1 tablet by mouth daily.   Yes [provider]  Omega-3 Fatty Acids (FISH OIL PO) Take 1 capsule by mouth daily.   Yes [provider]  elvitegravir-cobicistat-emtricitabine-tenofovir (GENVOYA) 150-150-200-10 MG TABS tablet Take 1 tablet by mouth daily with breakfast. Patient not taking: Reported on 05/28/2019 06/28/18   Caccavale, Sophia, PA-C  ibuprofen (ADVIL,MOTRIN) 600 MG tablet Take 1 tablet (600 mg total) by mouth every 6 (six) hours as needed. Patient not taking: Reported on 05/28/2019 02/27/15   Jeannett Senior, PA-C   Right Knee Exam antalgic gait, soft tissue tenderness over lateral joint line, effusion, negative drawer sign, collateral  ligaments intact  Physical Examination: General appearance - alert, well appearing, and in no distress Mental status - alert, oriented to person, place, and time Chest - clear to auscultation, no wheezes, rales or rhonchi, symmetric air entry Heart - normal rate, regular rhythm, normal S1, S2, no murmurs, rubs, clicks or gallops Abdomen - soft, nontender, nondistended, no masses or organomegaly Neurological - alert, oriented, normal speech, no focal findings or movement disorder noted   Asessment/Plan--- Right knee lateral meniscal tear- - Plan right knee arthroscopy with meniscal debridement. Procedure risks and potential comps discussed with patient who elects to proceed. Goals are decreased pain and increased function with a high likelihood of achieving both

## 2019-05-31 NOTE — Discharge Instructions (Addendum)
 Dr. Frank Aluisio [facility] [address] [place], [place] [address] [phone]   Arthroscopic Procedure, Knee An arthroscopic procedure can find what is wrong with your knee. PROCEDURE Arthroscopy is a surgical technique that allows your orthopedic surgeon to diagnose and treat your knee injury with accuracy. They will look into your knee through a small instrument. This is almost like a small (pencil sized) telescope. Because arthroscopy affects your knee less than open knee surgery, you can anticipate a more rapid recovery. Taking an active role by following your caregiver's instructions will help with rapid and complete recovery. Use crutches, rest, elevation, ice, and knee exercises as instructed. The length of recovery depends on various factors including type of injury, age, physical condition, medical conditions, and your rehabilitation. Your knee is the joint between the large bones (femur and tibia) in your leg. Cartilage covers these bone ends which are smooth and slippery and allow your knee to bend and move smoothly. Two menisci, thick, semi-lunar shaped pads of cartilage which form a rim inside the joint, help absorb shock and stabilize your knee. Ligaments bind the bones together and support your knee joint. Muscles move the joint, help support your knee, and take stress off the joint itself. Because of this all programs and physical therapy to rehabilitate an injured or repaired knee require rebuilding and strengthening your muscles. AFTER THE PROCEDURE  After the procedure, you will be moved to a recovery area until most of the effects of the medication have worn off. Your caregiver will discuss the test results with you.   Only take over-the-counter or prescription medicines for pain, discomfort, or fever as directed by your caregiver.  SEEK MEDICAL CARE IF:   You have increased bleeding from your wounds.   You see redness,  swelling, or have increasing pain in your wounds.   You have pus coming from your wound.   You have an oral temperature above 102 F (38.9 C).   You notice a bad smell coming from the wound or dressing.   You have severe pain with any motion of your knee.  SEEK IMMEDIATE MEDICAL CARE IF:   You develop a rash.   You have difficulty breathing.   You have any allergic problems.  FURTHER INSTRUCTIONS:   ICE to the affected knee every three hours for 30 minutes at a time and then as needed for pain and swelling.  Continue to use ice on the knee for pain and swelling from surgery. You may notice swelling that will progress down to the foot and ankle.  This is normal after surgery.  Elevate the leg when you are not up walking on it.    DIET You may resume your previous home diet once your are discharged from the hospital.  DRESSING / WOUND CARE / SHOWERING You may shower 3 days after surgery, but keep the wounds dry during showering.  You may use an occlusive plastic wrap (Press'n Seal for example), NO SOAKING/SUBMERGING IN THE BATHTUB.  If the bandage gets wet, change with a clean dry gauze.  If the incision gets wet, pat the wound dry with a clean towel. You may start showering two days after being discharged home but do not submerge the incisions under water.  Change dressing 48 hours after the procedure and then cover the small incisions with band aids until your follow up visit. Change the surgical dressings daily and reapply a dry dressing each time.   ACTIVITY   Walk with your walker as instructed. Use walker as long as suggested by your caregivers. Avoid periods of inactivity such as sitting longer than an hour when not asleep. This helps prevent blood clots.  You may resume a sexual relationship in one month or when given the OK by your doctor.  You may return to work once you are cleared by your doctor.  Do not drive a car for 6 weeks or until released by you surgeon.  Do not  drive while taking narcotics.  WEIGHT BEARING Weight bearing as tolerated with assist device (walker, cane, etc) as directed, use it as long as suggested by your surgeon or therapist, typically at least 4-6 weeks.  POSTOPERATIVE CONSTIPATION PROTOCOL Constipation - defined medically as fewer than three stools per week and severe constipation as less than one stool per week.  One of the most common issues patients have following surgery is constipation.  Even if you have a regular bowel pattern at home, your normal regimen is likely to be disrupted due to multiple reasons following surgery.  Combination of anesthesia, postoperative narcotics, change in appetite and fluid intake all can affect your bowels.  In order to avoid complications following surgery, here are some recommendations in order to help you during your recovery period.  Colace (docusate) - Pick up an over-the-counter form of Colace or another stool softener and take twice a day as long as you are requiring postoperative pain medications.  Take with a full glass of water daily.  If you experience loose stools or diarrhea, hold the colace until you stool forms back up.  If your symptoms do not get better within 1 week or if they get worse, check with your doctor.  Dulcolax (bisacodyl) - Pick up over-the-counter and take as directed by the product packaging as needed to assist with the movement of your bowels.  Take with a full glass of water.  Use this product as needed if not relieved by Colace only.   MiraLax (polyethylene glycol) - Pick up over-the-counter to have on hand.  MiraLax is a solution that will increase the amount of water in your bowels to assist with bowel movements.  Take as directed and can mix with a glass of water, juice, soda, coffee, or tea.  Take if you go more than two days without a movement. Do not use MiraLax more than once per day. Call your doctor if you are still constipated or irregular after using this  medication for 7 days in a row.  If you continue to have problems with postoperative constipation, please contact the office for further assistance and recommendations.  If you experience "the worst abdominal pain ever" or develop nausea or vomiting, please contact the office immediatly for further recommendations for treatment.  ITCHING  If you experience itching with your medications, try taking only a single pain pill, or even half a pain pill at a time.  You can also use Benadryl over the counter for itching or also to help with sleep.   TED HOSE STOCKINGS Wear the elastic stockings on both legs for three weeks following surgery during the day but you may remove then at night for sleeping.  MEDICATIONS See your medication summary on the "After Visit Summary" that the nursing staff will review with you prior to discharge.  You may have some home medications which will be placed on hold until you complete the course of blood thinner medication.  It is important for you to complete the   blood thinner medication as prescribed by your surgeon.  Continue your approved medications as instructed at time of discharge. Do not drive while taking narcotics.   PRECAUTIONS If you experience chest pain or shortness of breath - call 911 immediately for transfer to the hospital emergency department.  If you develop a fever greater that 101 F, purulent drainage from wound, increased redness or drainage from wound, foul odor from the wound/dressing, or calf pain - CONTACT YOUR SURGEON.                                                   FOLLOW-UP APPOINTMENTS Make sure you keep all of your appointments after your operation with your surgeon and caregivers. You should call the office at [phone]  and make an appointment for approximately one week after the date of your surgery or on the date instructed by your surgeon outlined in the "After Visit Summary".  RANGE OF MOTION AND STRENGTHENING EXERCISES   Rehabilitation of the knee is important following a knee injury or an operation. After just a few days of immobilization, the muscles of the thigh which control the knee become weakened and shrink (atrophy). Knee exercises are designed to build up the tone and strength of the thigh muscles and to improve knee motion. Often times heat used for twenty to thirty minutes before working out will loosen up your tissues and help with improving the range of motion but do not use heat for the first two weeks following surgery. These exercises can be done on a training (exercise) mat, on the floor, on a table or on a bed. Use what ever works the best and is most comfortable for you Knee exercises include:  QUAD STRENGTHENING EXERCISES Strengthening Quadriceps Sets  Tighten muscles on top of thigh by pushing knees down into floor or table. Hold for 20 seconds. Repeat 10 times. Do 2 sessions per day.     Strengthening Terminal Knee Extension  With knee bent over bolster, straighten knee by tightening muscle on top of thigh. Be sure to keep bottom of knee on bolster. Hold for 20 seconds. Repeat 10 times. Do 2 sessions per day.   Straight Leg with Bent Knee  Lie on back with opposite leg bent. Keep involved knee slightly bent at knee and raise leg 4-6". Hold for 10 seconds. Repeat 20 times per set. Do 2 sets per session. Do 2 sessions per day.   

## 2019-06-01 ENCOUNTER — Encounter (HOSPITAL_COMMUNITY): Payer: Self-pay | Admitting: Orthopedic Surgery

## 2020-04-28 ENCOUNTER — Encounter (HOSPITAL_COMMUNITY): Payer: Self-pay

## 2020-04-28 NOTE — Patient Instructions (Addendum)
DUE TO COVID-19 ONLY ONE VISITOR IS ALLOWED TO COME WITH YOU AND STAY IN THE WAITING ROOM ONLY DURING PRE OP AND PROCEDURE.    COVID SWAB TESTING MUST BE COMPLETED ON:  Thursday, Oct. 7, 2021 at 2:55 PM   [address] [place], [place] [id number]  (Must self quarantine after testing. Follow instructions on handout.)       Your procedure is scheduled on: Monday, Oct. 11, 2021   Report to [facility] Main  Entrance    Report to admitting at 12:45 PM   Call this number if you have problems the morning of surgery [phone]   Do not eat food :After Midnight.   May have liquids until 11:45AM   day of surgery  CLEAR LIQUID DIET  Foods Allowed                                                                     Foods Excluded  Water, Black Coffee and tea, regular and decaf                             liquids that you cannot  Plain Jell-O in any flavor  (No red)                                           see through such as: Fruit ices (not with fruit pulp)                                     milk, soups, orange juice              Iced Popsicles (No red)                                    All solid food                                   Apple juices Sports drinks like Gatorade (No red) Lightly seasoned clear broth or consume(fat free) Sugar, honey syrup  Sample Menu Breakfast                                Lunch                                     Supper Cranberry juice                    Beef broth                            Chicken broth Jell-O  Grape juice                           Apple juice Coffee or tea                        Jell-O                                      Popsicle                                                Coffee or tea                        Coffee or tea     Oral Hygiene is also important to reduce your risk of infection.                                    Remember - BRUSH YOUR TEETH THE MORNING OF SURGERY WITH YOUR  REGULAR TOOTHPASTE   Do NOT smoke after Midnight   Take these medicines the morning of surgery with A SIP OF WATER: None                               You may not have any metal on your body including  jewelry, and body piercings             Do not wear lotions, powders, perfumes/cologne, or deodorant                           Men may shave face and neck.   Do not bring valuables to the hospital. [facility] IS NOT             RESPONSIBLE   FOR VALUABLES.   Contacts, dentures or bridgework may not be worn into surgery.    Patients discharged the day of surgery will not be allowed to drive home.   Special Instructions: Bring a copy of your healthcare power of attorney and living will documents         the day of surgery if you haven't scanned them in before.              Please read over the following fact sheets you were given: IF YOU HAVE QUESTIONS ABOUT YOUR PRE OP INSTRUCTIONS PLEASE CALL [phone]   [facility] [place] - Preparing for Surgery Before surgery, you can play an important role.  Because skin is not sterile, your skin needs to be as free of germs as possible.  You can reduce the number of germs on your skin by washing with Original Dial Soap Synetta Shadow) before surgery.  Original Dial Soap [employer]) is an antiseptic cleaner which kills germs and bonds with the skin to continue killing germs even after washing.  You may shave your face/neck.  Please follow these instructions carefully:  1.  Shower with Original Dial Soap ([place]) the night before surgery and the  morning of surgery.  2.  If you choose  to wash your hair, wash your hair first as usual with your normal  shampoo.  3.  After you shampoo, rinse your hair and body thoroughly to remove the shampoo.                             4.  Use Original Dial Soap [employer]) as you would any other liquid soap.  You can apply chg directly to the skin and wash.  Gently with a scrungie or clean washcloth.  5.  Apply the Original  Dial Soap ([place]) to your body ONLY FROM THE NECK DOWN.   Do   not use on face/ open                           Wound or open sores. Avoid contact with eyes, ears mouth and               6.  Wash thoroughly, paying special attention to the area where your    surgery  will be performed.  7.  Thoroughly rinse your body with warm water from the neck down.  8.  DO NOT shower/wash with your normal soap after using and rinsing off the Original Dial Soap [employer]).                9.  Pat yourself dry with a clean towel.            10.  Wear clean pajamas.            11.  Place clean sheets on your bed the night of your first shower and do not  sleep with pets. Day of Surgery : Do not apply any lotions/deodorants the morning of surgery.  Please wear clean clothes to the hospital/surgery center.  FAILURE TO FOLLOW THESE INSTRUCTIONS MAY RESULT IN THE CANCELLATION OF YOUR SURGERY  PATIENT SIGNATURE_________________________________  NURSE SIGNATURE__________________________________  ________________________________________________________________________   Antonio Conner  An incentive spirometer is a tool that can help keep your lungs clear and active. This tool measures how well you are filling your lungs with each breath. Taking long deep breaths may help reverse or decrease the chance of developing breathing (pulmonary) problems (especially infection) following:  A long period of time when you are unable to move or be active. BEFORE THE PROCEDURE   If the spirometer includes an indicator to show your best effort, your nurse or respiratory therapist will set it to a desired goal.  If possible, sit up straight or lean slightly forward. Try not to slouch.  Hold the incentive spirometer in an upright position. INSTRUCTIONS FOR USE  1. Sit on the edge of your bed if possible, or sit up as far as you can in bed or on a chair. 2. Hold the incentive spirometer in an upright  position. 3. Breathe out normally. 4. Place the mouthpiece in your mouth and seal your lips tightly around it. 5. Breathe in slowly and as deeply as possible, raising the piston or the ball toward the top of the column. 6. Hold your breath for 3-5 seconds or for as long as possible. Allow the piston or ball to fall to the bottom of the column. 7. Remove the mouthpiece from your mouth and breathe out normally. 8. Rest for a few seconds and repeat Steps 1 through 7 at least 10 times every 1-2 hours when you are awake. Take  your time and take a few normal breaths between deep breaths. 9. The spirometer may include an indicator to show your best effort. Use the indicator as a goal to work toward during each repetition. 10. After each set of 10 deep breaths, practice coughing to be sure your lungs are clear. If you have an incision (the cut made at the time of surgery), support your incision when coughing by placing a pillow or rolled up towels firmly against it. Once you are able to get out of bed, walk around indoors and cough well. You may stop using the incentive spirometer when instructed by your caregiver.  RISKS AND COMPLICATIONS  Take your time so you do not get dizzy or light-headed.  If you are in pain, you may need to take or ask for pain medication before doing incentive spirometry. It is harder to take a deep breath if you are having pain. AFTER USE  Rest and breathe slowly and easily.  It can be helpful to keep track of a log of your progress. Your caregiver can provide you with a simple table to help with this. If you are using the spirometer at home, follow these instructions: SEEK MEDICAL CARE IF:   You are having difficultly using the spirometer.  You have trouble using the spirometer as often as instructed.  Your pain medication is not giving enough relief while using the spirometer.  You develop fever of 100.5 F (38.1 C) or higher. SEEK IMMEDIATE MEDICAL CARE IF:    You cough up bloody sputum that had not been present before.  You develop fever of 102 F (38.9 C) or greater.  You develop worsening pain at or near the incision site. MAKE SURE YOU:   Understand these instructions.  Will watch your condition.  Will get help right away if you are not doing well or get worse. Document Released: 11/25/2006 Document Revised: 10/07/2011 Document Reviewed: 01/26/2007 [employer] Patient Information 2014 [place], [place].   ________________________________________________________________________

## 2020-04-28 NOTE — Progress Notes (Addendum)
COVID Vaccine Completed: No Date COVID Vaccine completed: N/A COVID vaccine manufacturer: N/A  PCP - Dr. Greggory Stallion Osei-Bonsu Cardiologist - N/A  Chest x-ray - N/A EKG - N/A Stress Test - N/A ECHO - greater than 2 years Cardiac Cath - N/A Pacemaker/ICD device last checked:N/A  Sleep Study - Yes CPAP - No CPAP  Fasting Blood Sugar - N/A Checks Blood Sugar __N/A___ times a day  Blood Thinner Instructions: N/A Aspirin Instructions: N/A Last Dose: N/A  Anesthesia review: OSA, BMI 63.51  Patient denies shortness of breath, fever, cough and chest pain at PAT appointment   Patient verbalized understanding of instructions that were given to them at the PAT appointment. Patient was also instructed that they will need to review over the PAT instructions again at home before surgery.

## 2020-05-01 ENCOUNTER — Encounter (HOSPITAL_COMMUNITY)
Admission: RE | Admit: 2020-05-01 | Discharge: 2020-05-01 | Disposition: A | Discharge status: Home / Self Care | Payer: 59 | Source: Ambulatory Visit | Attending: Orthopedic Surgery | Admitting: Orthopedic Surgery

## 2020-05-01 ENCOUNTER — Other Ambulatory Visit: Payer: Self-pay

## 2020-05-01 ENCOUNTER — Encounter (HOSPITAL_COMMUNITY): Payer: Self-pay

## 2020-05-01 DIAGNOSIS — Y999 Unspecified external cause status: Secondary | ICD-10-CM | POA: Diagnosis not present

## 2020-05-01 DIAGNOSIS — Y929 Unspecified place or not applicable: Secondary | ICD-10-CM | POA: Insufficient documentation

## 2020-05-01 DIAGNOSIS — Z01812 Encounter for preprocedural laboratory examination: Secondary | ICD-10-CM | POA: Insufficient documentation

## 2020-05-01 DIAGNOSIS — Z6841 Body Mass Index (BMI) 40.0 and over, adult: Secondary | ICD-10-CM | POA: Insufficient documentation

## 2020-05-01 DIAGNOSIS — G4733 Obstructive sleep apnea (adult) (pediatric): Secondary | ICD-10-CM | POA: Diagnosis not present

## 2020-05-01 DIAGNOSIS — Y939 Activity, unspecified: Secondary | ICD-10-CM | POA: Insufficient documentation

## 2020-05-01 DIAGNOSIS — Z79899 Other long term (current) drug therapy: Secondary | ICD-10-CM | POA: Insufficient documentation

## 2020-05-01 DIAGNOSIS — S83281A Other tear of lateral meniscus, current injury, right knee, initial encounter: Secondary | ICD-10-CM | POA: Insufficient documentation

## 2020-05-01 HISTORY — DX: Gastro-esophageal reflux disease without esophagitis: K21.9

## 2020-05-01 HISTORY — DX: Morbid (severe) obesity due to excess calories: E66.01

## 2020-05-01 HISTORY — DX: Unspecified osteoarthritis, unspecified site: M19.90

## 2020-05-01 HISTORY — DX: Obstructive sleep apnea (adult) (pediatric): G47.33

## 2020-05-01 LAB — CBC
HCT: 41.3 % (ref 39.0–52.0)
Hemoglobin: 13 g/dL (ref 13.0–17.0)
MCH: 27.1 pg (ref 26.0–34.0)
MCHC: 31.5 g/dL (ref 30.0–36.0)
MCV: 86.2 fL (ref 80.0–100.0)
Platelets: 244 10*3/uL (ref 150–400)
RBC: 4.79 MIL/uL (ref 4.22–5.81)
RDW: 15.2 % (ref 11.5–15.5)
WBC: 8.2 10*3/uL (ref 4.0–10.5)
nRBC: 0 % (ref 0.0–0.2)

## 2020-05-01 NOTE — Progress Notes (Signed)
Requested a baribed from portable equipment.

## 2020-05-04 ENCOUNTER — Other Ambulatory Visit (HOSPITAL_COMMUNITY)
Admission: RE | Admit: 2020-05-04 | Discharge: 2020-05-04 | Disposition: A | Discharge status: Home / Self Care | Payer: 59 | Source: Ambulatory Visit | Attending: Orthopedic Surgery | Admitting: Orthopedic Surgery

## 2020-05-04 DIAGNOSIS — Z01818 Encounter for other preprocedural examination: Secondary | ICD-10-CM | POA: Insufficient documentation

## 2020-05-04 DIAGNOSIS — Z20822 Contact with and (suspected) exposure to covid-19: Secondary | ICD-10-CM | POA: Diagnosis not present

## 2020-05-04 LAB — SARS CORONAVIRUS 2 (TAT 6-24 HRS): SARS Coronavirus 2: NEGATIVE

## 2020-05-04 NOTE — Progress Notes (Addendum)
Anesthesia Chart Review   Case: [id number] Date/Time: 05/08/20 1435   Procedure: Right knee arthroscopy, meniscal debridement (Right Knee) -   Anesthesia type: Choice   Pre-op diagnosis: Right knee recurrent lateral meniscal tear   Location: [facility] ROOM 10 / [facility]   Surgeons: Ollen Gross, MD      DISCUSSION:34 y.o. never smoker with h/o GERD, OSA (he is not currently using CPAP), morbid obesity (BMI 63.51), right knee lateral meniscal tear scheduled for above procedure 05/08/2020 with Dr. Ollen Gross.   S/p sleeve gastrectomy 2017.  Negative cardiac workup prior to this.    S/p right knee arthroscopy 05/31/2019 with no anesthesia complications noted.    Discussed with Dr. Hart Rochester.  Anticipate pt can proceed with planned procedure barring acute status change.   VS: BP 134/84   Pulse 77   Temp 36.9 C (Oral)   Resp 16   Ht 5\' 10"  (1.778 m)   Wt (!) 200.8 kg   SpO2 98%   BMI 63.51 kg/m   PROVIDERS: , MD is PCP    LABS: Labs reviewed: Acceptable for surgery. (all labs ordered are listed, but only abnormal results are displayed)  Labs Reviewed  CBC     IMAGES:   EKG:   CV: Echo 04/21/2015 SUMMARY   Left ventricular systolic function is normal.  LV ejection fraction = 55-60%.  There is mild concentric left ventricular hypertrophy.   Left ventricular filling pattern is indeterminate.  The left atrium is mildly dilated.  Evaluation of the cardiac valves is limited by suboptimal image quality,  however, there does not appear to be significant valvular stenosis or  regurgitation.  The aortic root is normal size.  There is no comparison study available.   Past Medical History:  Diagnosis Date  . Arthritis   . GERD (gastroesophageal reflux disease)   . Lateral meniscus tear right knee  . Morbid obesity (HCC)   . OSA (obstructive sleep apnea)    Not currently using CPAP    Past Surgical History:  Procedure Laterality Date  . KNEE  ARTHROSCOPY Right 05/31/2019   Procedure: Right knee arthroscopy with lateral meniscal debridement;  Surgeon: 13/08/2018, MD;  Location: [facility];  Service: Orthopedics;  Laterality: Right;  Ollen Gross  . LAPAROSCOPIC GASTRIC BAND REMOVAL WITH LAPAROSCOPIC GASTRIC SLEEVE RESECTION  03/2015  . WISDOM TOOTH EXTRACTION  2010    MEDICATIONS: . ascorbic acid (VITAMIN C) 500 MG tablet  . Cholecalciferol (VITAMIN D3) 50 MCG (2000 UT) TABS  . magnesium 30 MG tablet  . Zinc 30 MG TABS   No current facility-administered medications for this encounter.     2011, PA-C [facility] Pre-Surgical Testing [phone]

## 2020-05-04 NOTE — Anesthesia Preprocedure Evaluation (Addendum)
Anesthesia Evaluation  Patient identified by MRN, date of birth, ID band Patient awake    Reviewed: Allergy & Precautions, NPO status , Patient's Chart, lab work & pertinent test results  Airway Mallampati: II  TM Distance: >3 FB Neck ROM: Full    Dental   Pulmonary    Pulmonary exam normal        Cardiovascular Normal cardiovascular exam     Neuro/Psych    GI/Hepatic GERD  Medicated and Controlled,  Endo/Other    Renal/GU      Musculoskeletal   Abdominal   Peds  Hematology   Anesthesia Other Findings   Reproductive/Obstetrics                             Anesthesia Physical Anesthesia Plan  ASA: III  Anesthesia Plan: General   Post-op Pain Management:    Induction: Intravenous  PONV Risk Score and Plan: 2 and Ondansetron and Midazolam  Airway Management Planned: Oral ETT  Additional Equipment:   Intra-op Plan:   Post-operative Plan: Extubation in OR  Informed Consent: I have reviewed the patients History and Physical, chart, labs and discussed the procedure including the risks, benefits and alternatives for the proposed anesthesia with the patient or authorized representative who has indicated his/her understanding and acceptance.       Plan Discussed with: CRNA and Surgeon  Anesthesia Plan Comments: (See PAT note 05/01/2020, Jodell Cipro, PA-C)       Anesthesia Quick Evaluation

## 2020-05-05 NOTE — Progress Notes (Signed)
Called patient and asked him to arrive 1220 on 05/08/20 for surgery. He verbalizes understanding.

## 2020-05-06 DIAGNOSIS — S83289A Other tear of lateral meniscus, current injury, unspecified knee, initial encounter: Secondary | ICD-10-CM | POA: Diagnosis present

## 2020-05-06 MED ORDER — DEXTROSE 5 % IV SOLN
3.0000 g | INTRAVENOUS | Status: AC
  Administered 2020-05-08: 3 g via INTRAVENOUS
  Filled 2020-05-06: qty 3

## 2020-05-06 NOTE — H&P (Signed)
CC- Antonio Conner is a 34 y.o. male who presents with right knee pain.  HPI- . Knee Pain: Patient presents with knee pain involving the  right knee. Onset of the symptoms was several months ago. Inciting event: he has had a few falls after his initial right knee arthroscopy and has a new lateral meniscal tear. Current symptoms include giving out, pain located laterally, stiffness and swelling. Pain is aggravated by going up and down stairs, lateral movements, rising after sitting, squatting, standing and walking.  Patient has had prior knee problems. Evaluation to date: MRI: abnormal lateral meniscal tear. Treatment to date: corticosteroid injection which was not very effective and PT which was not very effective.  Past Medical History:  Diagnosis Date  . Arthritis   . GERD (gastroesophageal reflux disease)   . Lateral meniscus tear right knee  . Morbid obesity (HCC)   . OSA (obstructive sleep apnea)    Not currently using CPAP    Past Surgical History:  Procedure Laterality Date  . KNEE ARTHROSCOPY Right 05/31/2019   Procedure: Right knee arthroscopy with lateral meniscal debridement;  Surgeon: Ollen Gross, MD;  Location: [facility];  Service: Orthopedics;  Laterality: Right;   . LAPAROSCOPIC GASTRIC BAND REMOVAL WITH LAPAROSCOPIC GASTRIC SLEEVE RESECTION  03/2015  . WISDOM TOOTH EXTRACTION  2010    Prior to Admission medications   Medication Sig Start Date End Date Taking? Authorizing Provider  ascorbic acid (VITAMIN C) 500 MG tablet Take 500 mg by mouth daily.   Yes [provider]  Cholecalciferol (VITAMIN D3) 50 MCG (2000 UT) TABS Take 2,000 Units by mouth daily.   Yes [provider]  magnesium 30 MG tablet Take 30 mg by mouth daily.   Yes [provider]  Zinc 30 MG TABS Take 30 mg by mouth daily.   Yes [provider]   Right Knee Exam antalgic gait, soft tissue tenderness over lateral joint line, no effusion, negative drawer sign,  collateral ligaments intact  Physical Examination: General appearance - alert, well appearing, and in no distress Mental status - alert, oriented to person, place, and time Chest - clear to auscultation, no wheezes, rales or rhonchi, symmetric air entry Heart - normal rate, regular rhythm, normal S1, S2, no murmurs, rubs, clicks or gallops Abdomen - soft, nontender, nondistended, no masses or organomegaly Neurological - alert, oriented, normal speech, no focal findings or movement disorder noted   Asessment/Plan--- Right knee lateral meniscal tear- - Plan right knee arthroscopy with meniscal debridement. Procedure risks and potential comps discussed with patient who elects to proceed. Goals are decreased pain and increased function with a high likelihood of achieving both

## 2020-05-08 ENCOUNTER — Ambulatory Visit (HOSPITAL_COMMUNITY): Payer: No Typology Code available for payment source | Admitting: Physician Assistant

## 2020-05-08 ENCOUNTER — Other Ambulatory Visit: Payer: Self-pay

## 2020-05-08 ENCOUNTER — Encounter (HOSPITAL_COMMUNITY): Payer: Self-pay | Admitting: Orthopedic Surgery

## 2020-05-08 ENCOUNTER — Ambulatory Visit (HOSPITAL_COMMUNITY)
Admission: RE | Admit: 2020-05-08 | Discharge: 2020-05-08 | Disposition: A | Discharge status: Home / Self Care | Payer: No Typology Code available for payment source | Attending: Orthopedic Surgery | Admitting: Orthopedic Surgery

## 2020-05-08 ENCOUNTER — Encounter (HOSPITAL_COMMUNITY)
Admission: RE | Disposition: A | Discharge status: Home / Self Care | Payer: Self-pay | Source: Home / Self Care | Attending: Orthopedic Surgery

## 2020-05-08 DIAGNOSIS — Z6841 Body Mass Index (BMI) 40.0 and over, adult: Secondary | ICD-10-CM | POA: Diagnosis not present

## 2020-05-08 DIAGNOSIS — S83281A Other tear of lateral meniscus, current injury, right knee, initial encounter: Secondary | ICD-10-CM | POA: Diagnosis not present

## 2020-05-08 DIAGNOSIS — G4733 Obstructive sleep apnea (adult) (pediatric): Secondary | ICD-10-CM | POA: Insufficient documentation

## 2020-05-08 DIAGNOSIS — W19XXXA Unspecified fall, initial encounter: Secondary | ICD-10-CM | POA: Insufficient documentation

## 2020-05-08 DIAGNOSIS — M94261 Chondromalacia, right knee: Secondary | ICD-10-CM | POA: Diagnosis not present

## 2020-05-08 DIAGNOSIS — K219 Gastro-esophageal reflux disease without esophagitis: Secondary | ICD-10-CM | POA: Diagnosis not present

## 2020-05-08 DIAGNOSIS — S83289A Other tear of lateral meniscus, current injury, unspecified knee, initial encounter: Secondary | ICD-10-CM | POA: Diagnosis present

## 2020-05-08 HISTORY — PX: KNEE ARTHROSCOPY: SHX127

## 2020-05-08 SURGERY — ARTHROSCOPY, KNEE
Anesthesia: General | Site: Knee | Laterality: Right

## 2020-05-08 MED ORDER — ROCURONIUM BROMIDE 10 MG/ML (PF) SYRINGE
PREFILLED_SYRINGE | INTRAVENOUS | Status: DC | PRN
  Administered 2020-05-08: 50 mg via INTRAVENOUS

## 2020-05-08 MED ORDER — SUGAMMADEX SODIUM 500 MG/5ML IV SOLN
INTRAVENOUS | Status: DC | PRN
  Administered 2020-05-08: 500 mg via INTRAVENOUS

## 2020-05-08 MED ORDER — SODIUM CHLORIDE 0.9 % IR SOLN
Status: DC | PRN
  Administered 2020-05-08: 12000 mL

## 2020-05-08 MED ORDER — DEXAMETHASONE SODIUM PHOSPHATE 10 MG/ML IJ SOLN
INTRAMUSCULAR | Status: DC | PRN
  Administered 2020-05-08: 10 mg via INTRAVENOUS

## 2020-05-08 MED ORDER — 0.9 % SODIUM CHLORIDE (POUR BTL) OPTIME
TOPICAL | Status: DC | PRN
  Administered 2020-05-08: 1000 mL

## 2020-05-08 MED ORDER — MEPERIDINE HCL 50 MG/ML IJ SOLN: 6.2500 mg | INTRAMUSCULAR | Status: DC | PRN

## 2020-05-08 MED ORDER — BUPIVACAINE-EPINEPHRINE 0.25% -1:200000 IJ SOLN
INTRAMUSCULAR | Status: DC | PRN
  Administered 2020-05-08: 20 mL

## 2020-05-08 MED ORDER — ONDANSETRON HCL 4 MG/2ML IJ SOLN
INTRAMUSCULAR | Status: AC
  Filled 2020-05-08: qty 2

## 2020-05-08 MED ORDER — HYDROMORPHONE HCL 1 MG/ML IJ SOLN
INTRAMUSCULAR | Status: AC
  Administered 2020-05-08: 0.5 mg via INTRAVENOUS
  Filled 2020-05-08: qty 1

## 2020-05-08 MED ORDER — MIDAZOLAM HCL 2 MG/2ML IJ SOLN
INTRAMUSCULAR | Status: AC
  Filled 2020-05-08: qty 2

## 2020-05-08 MED ORDER — HYDROMORPHONE HCL 1 MG/ML IJ SOLN
INTRAMUSCULAR | Status: AC
  Filled 2020-05-08: qty 1

## 2020-05-08 MED ORDER — HYDROCODONE-ACETAMINOPHEN 5-325 MG PO TABS
1.0000 | ORAL_TABLET | Freq: Four times a day (QID) | ORAL | 0 refills | Status: DC | PRN
Start: 2020-05-08 — End: 2021-02-18

## 2020-05-08 MED ORDER — MIDAZOLAM HCL 5 MG/5ML IJ SOLN
INTRAMUSCULAR | Status: DC | PRN
  Administered 2020-05-08: 2 mg via INTRAVENOUS

## 2020-05-08 MED ORDER — ONDANSETRON HCL 4 MG/2ML IJ SOLN: 4.0000 mg | Freq: Once | INTRAMUSCULAR | Status: DC | PRN

## 2020-05-08 MED ORDER — ONDANSETRON HCL 4 MG/2ML IJ SOLN
INTRAMUSCULAR | Status: DC | PRN
  Administered 2020-05-08: 4 mg via INTRAVENOUS

## 2020-05-08 MED ORDER — BUPIVACAINE-EPINEPHRINE (PF) 0.25% -1:200000 IJ SOLN
INTRAMUSCULAR | Status: AC
  Filled 2020-05-08: qty 30

## 2020-05-08 MED ORDER — LACTATED RINGERS IV SOLN: INTRAVENOUS | Status: DC

## 2020-05-08 MED ORDER — POVIDONE-IODINE 10 % EX SWAB
2.0000 "application " | Freq: Once | CUTANEOUS | Status: AC
  Administered 2020-05-08: 2 via TOPICAL

## 2020-05-08 MED ORDER — HYDROMORPHONE HCL 1 MG/ML IJ SOLN
0.2500 mg | INTRAMUSCULAR | Status: DC | PRN
  Administered 2020-05-08: 0.5 mg via INTRAVENOUS

## 2020-05-08 MED ORDER — LIDOCAINE 2% (20 MG/ML) 5 ML SYRINGE
INTRAMUSCULAR | Status: DC | PRN
  Administered 2020-05-08: 60 mg via INTRAVENOUS

## 2020-05-08 MED ORDER — FENTANYL CITRATE (PF) 100 MCG/2ML IJ SOLN
INTRAMUSCULAR | Status: AC
  Filled 2020-05-08: qty 2

## 2020-05-08 MED ORDER — METHOCARBAMOL 500 MG PO TABS: 500.0000 mg | ORAL_TABLET | Freq: Four times a day (QID) | ORAL | 0 refills | Status: DC | PRN

## 2020-05-08 MED ORDER — DEXAMETHASONE SODIUM PHOSPHATE 10 MG/ML IJ SOLN
INTRAMUSCULAR | Status: AC
  Filled 2020-05-08: qty 1

## 2020-05-08 MED ORDER — FENTANYL CITRATE (PF) 100 MCG/2ML IJ SOLN
INTRAMUSCULAR | Status: DC | PRN
  Administered 2020-05-08: 250 ug via INTRAVENOUS
  Administered 2020-05-08: 50 ug via INTRAVENOUS
  Administered 2020-05-08: 125 ug via INTRAVENOUS
  Administered 2020-05-08: 100 ug via INTRAVENOUS

## 2020-05-08 MED ORDER — FENTANYL CITRATE (PF) 100 MCG/2ML IJ SOLN
INTRAMUSCULAR | Status: AC
Start: 2020-05-08 — End: ?
  Filled 2020-05-08: qty 2

## 2020-05-08 MED ORDER — POVIDONE-IODINE 7.5 % EX SOLN: Freq: Once | CUTANEOUS | Status: AC

## 2020-05-08 SURGICAL SUPPLY — 30 items
BNDG ELASTIC 6X5.8 VLCR STR LF (GAUZE/BANDAGES/DRESSINGS) ×2 IMPLANT
COVER SURGICAL LIGHT HANDLE (MISCELLANEOUS) ×2 IMPLANT
COVER WAND RF STERILE (DRAPES) IMPLANT
DISSECTOR 4.0MM X 13CM (MISCELLANEOUS) ×2 IMPLANT
DRAPE U-SHAPE 47X51 STRL (DRAPES) ×2 IMPLANT
DRSG EMULSION OIL 3X3 NADH (GAUZE/BANDAGES/DRESSINGS) ×2 IMPLANT
DRSG PAD ABDOMINAL 8X10 ST (GAUZE/BANDAGES/DRESSINGS) ×2 IMPLANT
DURAPREP 26ML APPLICATOR (WOUND CARE) ×2 IMPLANT
GAUZE 4X4 16PLY RFD (DISPOSABLE) ×2 IMPLANT
GAUZE SPONGE 4X4 12PLY STRL (GAUZE/BANDAGES/DRESSINGS) ×2 IMPLANT
GLOVE BIO SURGEON STRL SZ8 (GLOVE) ×2 IMPLANT
GLOVE BIOGEL PI IND STRL 7.0 (GLOVE) ×1 IMPLANT
GLOVE BIOGEL PI IND STRL 8 (GLOVE) ×1 IMPLANT
GLOVE BIOGEL PI INDICATOR 7.0 (GLOVE) ×1
GLOVE BIOGEL PI INDICATOR 8 (GLOVE) ×1
GLOVE SURG SS PI 7.0 STRL IVOR (GLOVE) ×2 IMPLANT
GOWN STRL REUS W/TWL LRG LVL3 (GOWN DISPOSABLE) ×4 IMPLANT
KIT BASIN OR (CUSTOM PROCEDURE TRAY) ×2 IMPLANT
KIT TURNOVER KIT A (KITS) IMPLANT
MANIFOLD NEPTUNE II (INSTRUMENTS) ×2 IMPLANT
PACK ARTHROSCOPY WL (CUSTOM PROCEDURE TRAY) ×2 IMPLANT
PACK ICE MAXI GEL EZY WRAP (MISCELLANEOUS) ×6 IMPLANT
PADDING CAST COTTON 6X4 STRL (CAST SUPPLIES) ×2 IMPLANT
PORT APPOLLO RF 90DEGREE MULTI (SURGICAL WAND) ×2 IMPLANT
PROTECTOR NERVE ULNAR (MISCELLANEOUS) ×2 IMPLANT
SUT ETHILON 4 0 PS 2 18 (SUTURE) ×2 IMPLANT
TOWEL OR 17X26 10 PK STRL BLUE (TOWEL DISPOSABLE) ×2 IMPLANT
TUBING ARTHROSCOPY IRRIG 16FT (MISCELLANEOUS) ×2 IMPLANT
TUBING CONNECTING 10 (TUBING) ×2 IMPLANT
WRAP KNEE MAXI GEL POST OP (GAUZE/BANDAGES/DRESSINGS) ×2 IMPLANT

## 2020-05-08 NOTE — Discharge Instructions (Addendum)
Dr. Ollen Gross Total Joint Specialist [facility] [address], [address] [place], [place] [address] [phone]   Arthroscopic Procedure, Knee An arthroscopic procedure can find what is wrong with your knee. PROCEDURE Arthroscopy is a surgical technique that allows your orthopedic surgeon to diagnose and treat your knee injury with accuracy. They will look into your knee through a small instrument. This is almost like a small (pencil sized) telescope. Because arthroscopy affects your knee less than open knee surgery, you can anticipate a more rapid recovery. Taking an active role by following your caregiver's instructions will help with rapid and complete recovery. Use crutches, rest, elevation, ice, and knee exercises as instructed. The length of recovery depends on various factors including type of injury, age, physical condition, medical conditions, and your rehabilitation. Your knee is the joint between the large bones (femur and tibia) in your leg. Cartilage covers these bone ends which are smooth and slippery and allow your knee to bend and move smoothly. Two menisci, thick, semi-lunar shaped pads of cartilage which form a rim inside the joint, help absorb shock and stabilize your knee. Ligaments bind the bones together and support your knee joint. Muscles move the joint, help support your knee, and take stress off the joint itself. Because of this all programs and physical therapy to rehabilitate an injured or repaired knee require rebuilding and strengthening your muscles. AFTER THE PROCEDURE  After the procedure, you will be moved to a recovery area until most of the effects of the medication have worn off. Your caregiver will discuss the test results with you.   Only take over-the-counter or prescription medicines for pain, discomfort, or fever as directed by your caregiver.  SEEK MEDICAL CARE IF:   You have increased bleeding from your wounds.   You see redness,  swelling, or have increasing pain in your wounds.   You have pus coming from your wound.   You have an oral temperature above 102 F (38.9 C).   You notice a bad smell coming from the wound or dressing.   You have severe pain with any motion of your knee.  SEEK IMMEDIATE MEDICAL CARE IF:   You develop a rash.   You have difficulty breathing.   You have any allergic problems.  FURTHER INSTRUCTIONS:   ICE to the affected knee every three hours for 30 minutes at a time and then as needed for pain and swelling.  Continue to use ice on the knee for pain and swelling from surgery. You may notice swelling that will progress down to the foot and ankle.  This is normal after surgery.  Elevate the leg when you are not up walking on it.    DIET You may resume your previous home diet once your are discharged from the hospital.  DRESSING / WOUND CARE / SHOWERING You may change your dressing 3-5 days after surgery.  Then change the dressing every day with sterile gauze.  Please use good hand washing techniques before changing the dressing.  Do not use any lotions or creams on the incision until instructed by your surgeon. You may start showering two days after being discharged home but do not submerge the incisions under water.  Change dressing 48 hours after the procedure and then cover the small incisions with band aids until your follow up visit. Change the surgical dressings daily and reapply a dry dressing each time.   ACTIVITY Walk with your walker as instructed. Use walker as long as  suggested by your caregivers. Avoid periods of inactivity such as sitting longer than an hour when not asleep. This helps prevent blood clots.  You may resume a sexual relationship in one month or when given the OK by your doctor.  You may return to work once you are cleared by your doctor.  Do not drive a car for 6 weeks or until released by you surgeon.  Do not drive while taking narcotics.  WEIGHT  BEARING Weight bearing as tolerated with assist device (walker, cane, etc) as directed, use it as long as suggested by your surgeon or therapist, typically at least 4-6 weeks.  POSTOPERATIVE CONSTIPATION PROTOCOL Constipation - defined medically as fewer than three stools per week and severe constipation as less than one stool per week.  One of the most common issues patients have following surgery is constipation.  Even if you have a regular bowel pattern at home, your normal regimen is likely to be disrupted due to multiple reasons following surgery.  Combination of anesthesia, postoperative narcotics, change in appetite and fluid intake all can affect your bowels.  In order to avoid complications following surgery, here are some recommendations in order to help you during your recovery period.  Colace (docusate) - Pick up an over-the-counter form of Colace or another stool softener and take twice a day as long as you are requiring postoperative pain medications.  Take with a full glass of water daily.  If you experience loose stools or diarrhea, hold the colace until you stool forms back up.  If your symptoms do not get better within 1 week or if they get worse, check with your doctor.  Dulcolax (bisacodyl) - Pick up over-the-counter and take as directed by the product packaging as needed to assist with the movement of your bowels.  Take with a full glass of water.  Use this product as needed if not relieved by Colace only.   MiraLax (polyethylene glycol) - Pick up over-the-counter to have on hand.  MiraLax is a solution that will increase the amount of water in your bowels to assist with bowel movements.  Take as directed and can mix with a glass of water, juice, soda, coffee, or tea.  Take if you go more than two days without a movement. Do not use MiraLax more than once per day. Call your doctor if you are still constipated or irregular after using this medication for 7 days in a row.  If you  continue to have problems with postoperative constipation, please contact the office for further assistance and recommendations.  If you experience "the worst abdominal pain ever" or develop nausea or vomiting, please contact the office immediatly for further recommendations for treatment.  ITCHING  If you experience itching with your medications, try taking only a single pain pill, or even half a pain pill at a time.  You can also use Benadryl over the counter for itching or also to help with sleep.   TED HOSE STOCKINGS Wear the elastic stockings on both legs for three weeks following surgery during the day but you may remove then at night for sleeping.  MEDICATIONS See your medication summary on the "After Visit Summary" that the nursing staff will review with you prior to discharge.  You may have some home medications which will be placed on hold until you complete the course of blood thinner medication.  It is important for you to complete the blood thinner medication as prescribed by your surgeon.  Continue your  approved medications as instructed at time of discharge. Do not drive while taking narcotics.   PRECAUTIONS If you experience chest pain or shortness of breath - call 911 immediately for transfer to the hospital emergency department.  If you develop a fever greater that 101 F, purulent drainage from wound, increased redness or drainage from wound, foul odor from the wound/dressing, or calf pain - CONTACT YOUR SURGEON.                                                   FOLLOW-UP APPOINTMENTS Make sure you keep all of your appointments after your operation with your surgeon and caregivers. You should call the office at [phone]  and make an appointment for approximately one week after the date of your surgery or on the date instructed by your surgeon outlined in the "After Visit Summary".  RANGE OF MOTION AND STRENGTHENING EXERCISES  Rehabilitation of the knee is important  following a knee injury or an operation. After just a few days of immobilization, the muscles of the thigh which control the knee become weakened and shrink (atrophy). Knee exercises are designed to build up the tone and strength of the thigh muscles and to improve knee motion. Often times heat used for twenty to thirty minutes before working out will loosen up your tissues and help with improving the range of motion but do not use heat for the first two weeks following surgery. These exercises can be done on a training (exercise) mat, on the floor, on a table or on a bed. Use what ever works the best and is most comfortable for you Knee exercises include:  QUAD STRENGTHENING EXERCISES Strengthening Quadriceps Sets  Tighten muscles on top of thigh by pushing knees down into floor or table. Hold for 20 seconds. Repeat 10 times. Do 2 sessions per day.     Strengthening Terminal Knee Extension  With knee bent over bolster, straighten knee by tightening muscle on top of thigh. Be sure to keep bottom of knee on bolster. Hold for 20 seconds. Repeat 10 times. Do 2 sessions per day.   Straight Leg with Bent Knee  Lie on back with opposite leg bent. Keep involved knee slightly bent at knee and raise leg 4-6". Hold for 10 seconds. Repeat 20 times per set. Do 2 sets per session. Do 2 sessions per day.          General Anesthesia, Adult, Care After This sheet gives you information about how to care for yourself after your procedure. Your health care provider may also give you more specific instructions. If you have problems or questions, contact your health care provider. What can I expect after the procedure? After the procedure, the following side effects are common:  Pain or discomfort at the IV site.  Nausea.  Vomiting.  Sore throat.  Trouble concentrating.  Feeling cold or chills.  Weak or tired.  Sleepiness and fatigue.  Soreness and body aches. These side effects can  affect parts of the body that were not involved in surgery. Follow these instructions at home:  For at least 24 hours after the procedure:  Have a responsible adult stay with you. It is important to have someone help care for you until you are awake and alert.  Rest as needed.  Do not: ? Participate in activities in  which you could fall or become injured. ? Drive. ? Use heavy machinery. ? Drink alcohol. ? Take sleeping pills or medicines that cause drowsiness. ? Make important decisions or sign legal documents. ? Take care of children on your own. Eating and drinking  Follow any instructions from your health care provider about eating or drinking restrictions.  When you feel hungry, start by eating small amounts of foods that are soft and easy to digest (bland), such as toast. Gradually return to your regular diet.  Drink enough fluid to keep your urine pale yellow.  If you vomit, rehydrate by drinking water, juice, or clear broth. General instructions  If you have sleep apnea, surgery and certain medicines can increase your risk for breathing problems. Follow instructions from your health care provider about wearing your sleep device: ? Anytime you are sleeping, including during daytime naps. ? While taking prescription pain medicines, sleeping medicines, or medicines that make you drowsy.  Return to your normal activities as told by your health care provider. Ask your health care provider what activities are safe for you.  Take over-the-counter and prescription medicines only as told by your health care provider.  If you smoke, do not smoke without supervision.  Keep all follow-up visits as told by your health care provider. This is important. Contact a health care provider if:  You have nausea or vomiting that does not get better with medicine.  You cannot eat or drink without vomiting.  You have pain that does not get better with medicine.  You are unable to pass  urine.  You develop a skin rash.  You have a fever.  You have redness around your IV site that gets worse. Get help right away if:  You have difficulty breathing.  You have chest pain.  You have blood in your urine or stool, or you vomit blood. Summary  After the procedure, it is common to have a sore throat or nausea. It is also common to feel tired.  Have a responsible adult stay with you for the first 24 hours after general anesthesia. It is important to have someone help care for you until you are awake and alert.  When you feel hungry, start by eating small amounts of foods that are soft and easy to digest (bland), such as toast. Gradually return to your regular diet.  Drink enough fluid to keep your urine pale yellow.  Return to your normal activities as told by your health care provider. Ask your health care provider what activities are safe for you. This information is not intended to replace advice given to you by your health care provider. Make sure you discuss any questions you have with your health care provider. Document Revised: 07/18/2017 Document Reviewed: 02/28/2017 Elsevier Patient Education  2020 [employer]

## 2020-05-08 NOTE — Op Note (Signed)
Operative Report- KNEE ARTHROSCOPY  Preoperative diagnosis-  Right knee lateral meniscal tear  Postoperative diagnosis Right- knee lateral meniscal tear   Procedure- Right knee arthroscopy with lateral  meniscal debridement    Surgeon- Gus Rankin. Geneve Kimpel, MD  Anesthesia-General  EBL-  Minimal  Complications- None  Condition- PACU - hemodynamically stable.  Brief clinical note- -Antonio Conner is a 34 y.o.  male with a several month history of Right pain and mechanical symptoms. Exam and history suggested recurrent lateral  meniscal tear confirmed by MRI. The patient presents now for arthroscopy and debridement   Procedure in detail -       After successful administration of General anesthetic,  the Right lower extremity is prepped and draped in the usual sterile fashion. Time out is performed by the surgical team. Standard superomedial and inferolateral portal sites are marked and incisions made with an 11 blade. The inflow cannula is passed through the superomedial portal and camera through the inferolateral portal and inflow is initiated. Arthroscopic visualization proceeds.      The undersurface of the patella and trochlea are visualized and there is Grade II/III chondromalacia trochlea otherwise normal. The medial and lateral gutters are visualized and there are  no loose bodies. Flexion and valgus force is applied to the knee and the medial compartment is entered. A spinal needle is passed into the joint through the site marked for the inferomedial portal. A small incision is made and the dilator passed into the joint. The findings for the medial compartment are normal . The chondromalacia in the trochlea was debrided to stable cartilage with a shaver.    The intercondylar notch is visualized and the ACL appears normal . The lateral compartment is entered and the findings are unstable tear body and posterior horn lateral meniscus . The tear is debrided to a stable base with baskets  and a shaver and sealed off with the Arthrocare.It is probed and found to be stable.     The joint is again inspected and there are no other tears, defects or loose bodies identified. The arthroscopic equipment is then removed from the inferior portals which are closed with interrupted 4-0 nylon. 20 ml of .25% Marcaine with epinephrine are injected through the inflow cannula and the cannula is then removed and the portal closed with nylon. The incisions are cleaned and dried and a bulky sterile dressing is applied. The patient is then awakened and transported to recovery in stable condition.   05/08/2020, 4:17 PM

## 2020-05-08 NOTE — Interval H&P Note (Signed)
History and Physical Interval Note:  05/08/2020 1:51 PM  Antonio Conner  has presented today for surgery, with the diagnosis of Right knee recurrent lateral meniscal tear.  The various methods of treatment have been discussed with the patient and family. After consideration of risks, benefits and other options for treatment, the patient has consented to  Procedure(s) with comments: Right knee arthroscopy, meniscal debridement (Right) - as a surgical intervention.  The patient's history has been reviewed, patient examined, no change in status, stable for surgery.  I have reviewed the patient's chart and labs.  Questions were answered to the patient's satisfaction.     [name]

## 2020-05-08 NOTE — Anesthesia Postprocedure Evaluation (Signed)
Anesthesia Post Note  Patient: Coe Angelos  Procedure(s) Performed: Right knee arthroscopy, lateral meniscal debridement (Right Knee)     Patient location during evaluation: PACU Anesthesia Type: General Level of consciousness: awake and alert Pain management: pain level controlled Vital Signs Assessment: post-procedure vital signs reviewed and stable Respiratory status: spontaneous breathing, nonlabored ventilation, respiratory function stable and patient connected to nasal cannula oxygen Cardiovascular status: blood pressure returned to baseline and stable Postop Assessment: no apparent nausea or vomiting Anesthetic complications: no   No complications documented.  Last Vitals:  Vitals:   05/08/20 1648 05/08/20 1655  BP:  136/84  Pulse:  91  Resp:  16  Temp:  36.9 C  SpO2: 96% 98%    Last Pain:  Vitals:   05/08/20 1655  TempSrc:   PainSc: 0-No pain                 Iliani Vejar DAVID

## 2020-05-08 NOTE — Progress Notes (Signed)
Orthopedic Tech Progress Note Patient Details:  Antonio Conner [birth year] [id number]  Ortho Devices Type of Ortho Device: Crutches Ortho Device/Splint Interventions: Ordered, Adjustment   Post Interventions Patient Tolerated: Well Instructions Provided: Care of device   Jennye Moccasin 05/08/2020, 5:41 PM

## 2020-05-08 NOTE — Anesthesia Procedure Notes (Signed)
Procedure Name: Intubation Date/Time: 05/08/2020 3:01 PM Performed by: Gerald Leitz, CRNA Pre-anesthesia Checklist: Patient identified, Patient being monitored, Timeout performed, Emergency Drugs available and Suction available Patient Re-evaluated:Patient Re-evaluated prior to induction Oxygen Delivery Method: Circle system utilized Preoxygenation: Pre-oxygenation with 100% oxygen Induction Type: IV induction Ventilation: Oral airway inserted - appropriate to patient size and Mask ventilation without difficulty Laryngoscope Size: Mac and 4 Grade View: Grade III Tube type: Oral Tube size: 7.5 mm Number of attempts: 1 Airway Equipment and Method: Stylet Placement Confirmation: ETT inserted through vocal cords under direct vision,  positive ETCO2 and breath sounds checked- equal and bilateral Secured at: 23 cm Tube secured with: Tape Dental Injury: Teeth and Oropharynx as per pre-operative assessment  Difficulty Due To: Difficult Airway- due to immobile epiglottis, Difficult Airway- due to reduced neck mobility and Difficult Airway- due to anterior larynx Future Recommendations: Recommend- induction with short-acting agent, and alternative techniques readily available Comments: LMA Aura Gain #5 attempted x 1, unable to get a quality seal or ventilate. Converted to ETT 7.5 x 2 attempts, difficult intubation see above charting. RSI.

## 2020-05-08 NOTE — Transfer of Care (Signed)
Immediate Anesthesia Transfer of Care Note  Patient: Antonio Conner  Procedure(s) Performed: Procedure(s) with comments: Right knee arthroscopy, lateral meniscal debridement (Right) -  Patient Location: PACU  Anesthesia Type:General  Level of Consciousness: Alert, Awake, Oriented  Airway & Oxygen Therapy: Patient Spontanous Breathing  Post-op Assessment: Report given to RN  Post vital signs: Reviewed and stable  Last Vitals:  Vitals:   05/08/20 1209  BP: (!) 156/91  Pulse: 92  Resp: 18  Temp: 36.7 C  SpO2: 98%    Complications: No apparent anesthesia complications

## 2020-05-09 ENCOUNTER — Encounter (HOSPITAL_COMMUNITY): Payer: Self-pay | Admitting: Orthopedic Surgery

## 2020-05-10 ENCOUNTER — Encounter (HOSPITAL_BASED_OUTPATIENT_CLINIC_OR_DEPARTMENT_OTHER): Payer: Self-pay | Admitting: Emergency Medicine

## 2020-05-10 ENCOUNTER — Emergency Department (HOSPITAL_BASED_OUTPATIENT_CLINIC_OR_DEPARTMENT_OTHER): Payer: 59

## 2020-05-10 ENCOUNTER — Other Ambulatory Visit: Payer: Self-pay

## 2020-05-10 ENCOUNTER — Emergency Department (HOSPITAL_BASED_OUTPATIENT_CLINIC_OR_DEPARTMENT_OTHER)
Admission: EM | Admit: 2020-05-10 | Discharge: 2020-05-10 | Disposition: A | Discharge status: Home / Self Care | Payer: 59 | Attending: Emergency Medicine | Admitting: Emergency Medicine

## 2020-05-10 DIAGNOSIS — Z91010 Allergy to peanuts: Secondary | ICD-10-CM | POA: Insufficient documentation

## 2020-05-10 DIAGNOSIS — T7840XA Allergy, unspecified, initial encounter: Secondary | ICD-10-CM | POA: Insufficient documentation

## 2020-05-10 DIAGNOSIS — Z5333 Arthroscopic surgical procedure converted to open procedure: Secondary | ICD-10-CM | POA: Diagnosis not present

## 2020-05-10 DIAGNOSIS — Z9889 Other specified postprocedural states: Secondary | ICD-10-CM

## 2020-05-10 DIAGNOSIS — R0789 Other chest pain: Secondary | ICD-10-CM | POA: Diagnosis present

## 2020-05-10 DIAGNOSIS — H1133 Conjunctival hemorrhage, bilateral: Secondary | ICD-10-CM | POA: Diagnosis not present

## 2020-05-10 DIAGNOSIS — R079 Chest pain, unspecified: Secondary | ICD-10-CM

## 2020-05-10 LAB — BASIC METABOLIC PANEL
Anion gap: 10 (ref 5–15)
BUN: 14 mg/dL (ref 6–20)
CO2: 26 mmol/L (ref 22–32)
Calcium: 8.6 mg/dL — ABNORMAL LOW (ref 8.9–10.3)
Chloride: 103 mmol/L (ref 98–111)
Creatinine, Ser: 1.11 mg/dL (ref 0.61–1.24)
GFR, Estimated: >60 mL/min (ref >60)
Glucose, Bld: 130 mg/dL — ABNORMAL HIGH (ref 70–99)
Potassium: 3.7 mmol/L (ref 3.5–5.1)
Sodium: 139 mmol/L (ref 135–145)

## 2020-05-10 LAB — TROPONIN I (HIGH SENSITIVITY)
Troponin I (High Sensitivity): 4 ng/L (ref <18)
Troponin I (High Sensitivity): 4 ng/L (ref <18)

## 2020-05-10 LAB — CBC
HCT: 41.2 % (ref 39.0–52.0)
Hemoglobin: 13 g/dL (ref 13.0–17.0)
MCH: 27.3 pg (ref 26.0–34.0)
MCHC: 31.6 g/dL (ref 30.0–36.0)
MCV: 86.4 fL (ref 80.0–100.0)
Platelets: 251 10*3/uL (ref 150–400)
RBC: 4.77 MIL/uL (ref 4.22–5.81)
RDW: 15.6 % — ABNORMAL HIGH (ref 11.5–15.5)
WBC: 8 10*3/uL (ref 4.0–10.5)
nRBC: 0 % (ref 0.0–0.2)

## 2020-05-10 MED ORDER — IOHEXOL 350 MG/ML SOLN
100.0000 mL | Freq: Once | INTRAVENOUS | Status: AC | PRN
  Administered 2020-05-10: 125 mL via INTRAVENOUS

## 2020-05-10 NOTE — ED Triage Notes (Signed)
He had knee surgery Monday. Reports chest and rib pain since then and a rash to his face.

## 2020-05-10 NOTE — Discharge Instructions (Signed)
Your labwork and imaging were reassuring today. There were no signs of obvious blood clots in your lungs at this time. I would recommend continuing using your pain medication as needed and to follow up with your PCP.   I would recommend Benadryl daily for the swelling of your face. You may be experiencing an allergic reaction related to your recent surgery.   Keep appointment as scheduled with Dr. Despina Hick on 10/19.   Return to the ED IMMEDIATELY for any worsening symptoms including worsening pain, shortness of breath, coughing up blood, passing out, throat swelling, wheezing, hives, eye pain, blurry/double vision, or any other new/concerning symptoms.

## 2020-05-10 NOTE — ED Provider Notes (Signed)
[facility] EMERGENCY DEPARTMENT Provider Note   CSN: [id number] Arrival date & time: 05/10/20  1058     History Chief Complaint  Patient presents with  . Chest Pain    Antonio Conner is a 34 y.o. male with PMHx obesity and GERD with recent R knee arthroscopy and lateral meniscus debridement on 10/11 who presents to the ED today with complaint of gradual onset, constant, diffuse chest wall pain and pain with deep inspiration that began 2 days ago. Pt reports he woke up from surgery in the PACU and had chest pain at that time. He also reports feeling like his face was swollen and noticed redness to his bilateral eyes. Significant other at bedside reports they asked why he was having chest pain after surgery however no one could tell them why. As far as patient and SO are aware they report no complications during surgery. Pt states he called Dr. Tana Felts office yesterday regarding his chest pain and was told to go to his PCP's office with concern for possible PE. He reports he spoke with them at 5:30 PM yesterday and his PCP's office was closed. He went there today however was advised to come to the ED for further evaluation. Pt denies any hx of DVT/PE. He is a non smoker. Reports no previous history of chest pain. No FHX of CAD. No hemoptysis. No fevers or chills. No other complaints at this time.   The history is provided by the patient, a significant other and medical records.       Past Medical History:  Diagnosis Date  . Arthritis   . GERD (gastroesophageal reflux disease)   . Lateral meniscus tear right knee  . Morbid obesity (HCC)   . OSA (obstructive sleep apnea)    Not currently using CPAP    Patient Active Problem List   Diagnosis Date Noted  . Lateral meniscal tear 05/06/2020    Past Surgical History:  Procedure Laterality Date  . KNEE ARTHROSCOPY Right 05/31/2019   Procedure: Right knee arthroscopy with lateral meniscal debridement;  Surgeon: Ollen Gross,  MD;  Location: [facility];  Service: Orthopedics;  Laterality: Right;   . KNEE ARTHROSCOPY Right 05/08/2020   Procedure: Right knee arthroscopy, lateral meniscal debridement;  Surgeon: Ollen Gross, MD;  Location: [facility];  Service: Orthopedics;  Laterality: Right;   . LAPAROSCOPIC GASTRIC BAND REMOVAL WITH LAPAROSCOPIC GASTRIC SLEEVE RESECTION  03/2015  . WISDOM TOOTH EXTRACTION  2010       No family history on file.  Social History   Tobacco Use  . Smoking status: Never Smoker  . Smokeless tobacco: Never Used  Vaping Use  . Vaping Use: Never used  Substance Use Topics  . Alcohol use: No  . Drug use: No    Home Medications Prior to Admission medications   Medication Sig Start Date End Date Taking? Authorizing Provider  ascorbic acid (VITAMIN C) 500 MG tablet Take 500 mg by mouth daily.    [provider]  Cholecalciferol (VITAMIN D3) 50 MCG (2000 UT) TABS Take 2,000 Units by mouth daily.    [provider]  HYDROcodone-acetaminophen (NORCO/VICODIN) 5-325 MG tablet Take 1 tablet by mouth every 6 (six) hours as needed for moderate pain. 05/08/20 05/08/21  Edmisten, Kristie L, PA  magnesium 30 MG tablet Take 30 mg by mouth daily.    [provider]  methocarbamol (ROBAXIN) 500 MG tablet Take 1 tablet (500 mg total) by mouth every 6 (six) hours as  needed for muscle spasms. 05/08/20   Edmisten, Kristie L, PA  Zinc 30 MG TABS Take 30 mg by mouth daily.    [provider]    Allergies    Hibiclens  [chlorhexidine gluconate], Eggs or egg-derived products, Lac bovis, Peanut oil, Influenza vaccines, and Morphine and related  Review of Systems   Review of Systems  Constitutional: Negative for chills and fever.  HENT: Positive for sore throat.   Eyes: Positive for redness. Negative for pain and visual disturbance.  Respiratory: Positive for shortness of breath. Negative for cough.   Cardiovascular: Positive for chest pain. Negative for  palpitations and leg swelling.  Gastrointestinal: Negative for abdominal pain, nausea and vomiting.  All other systems reviewed and are negative.   Physical Exam Updated Vital Signs BP (!) 135/91 (BP Location: Left Arm)   Pulse 82   Temp 98.3 F (36.8 C) (Oral)   Resp 20   Ht 5\' 10"  (1.778 m)   Wt (!) 198.7 kg   SpO2 99%   BMI 62.85 kg/m   Physical Exam Vitals and nursing note reviewed.  Constitutional:      Appearance: He is not ill-appearing.     Comments: Obese  HENT:     Head: Atraumatic.     Comments: Mild bilateral periorbital edema noted  Uvula midline. No posterior oropharyngeal edema.  Eyes:     Extraocular Movements: Extraocular movements intact.     Conjunctiva/sclera: Conjunctivae normal.     Pupils: Pupils are equal, round, and reactive to light.     Comments: Small conjunctival hemorrhage noted to bilateral eyes  Cardiovascular:     Rate and Rhythm: Normal rate and regular rhythm.     Pulses:          Radial pulses are 2+ on the right side and 2+ on the left side.       Dorsalis pedis pulses are 2+ on the right side and 2+ on the left side.  Pulmonary:     Effort: Pulmonary effort is normal.     Breath sounds: Normal breath sounds. No decreased breath sounds, wheezing, rhonchi or rales.  Chest:     Chest wall: Tenderness present.     Comments: Diffuse chest wall TTP; no deformities, flail chest, or ecchymosis appreciated Abdominal:     Palpations: Abdomen is soft.     Tenderness: There is no abdominal tenderness. There is no guarding or rebound.  Musculoskeletal:     Cervical back: Neck supple.     Comments: Ace bandage to R knee removed; intact sutures from arthroscopy 2 days ago; no erythema, edema, or drainage; mild TTP to R knee; ROM limited; 2+ DP pulse  Skin:    General: Skin is warm and dry.  Neurological:     Mental Status: He is alert.     ED Results / Procedures / Treatments   Labs (all labs ordered are listed, but only abnormal  results are displayed) Labs Reviewed  BASIC METABOLIC PANEL - Abnormal; Notable for the following components:      Result Value   Glucose, Bld 130 (*)    Calcium 8.6 (*)    All other components within normal limits  CBC - Abnormal; Notable for the following components:   RDW 15.6 (*)    All other components within normal limits  TROPONIN I (HIGH SENSITIVITY)  TROPONIN I (HIGH SENSITIVITY)    EKG None  Radiology DG Chest 2 View  Result Date: 05/10/2020 CLINICAL DATA:  Chest  pain. EXAM: CHEST - 2 VIEW COMPARISON:  None. FINDINGS: The heart size and mediastinal contours are within normal limits. Both lungs are clear. No visible pleural effusions or pneumothorax. No acute osseous abnormality. IMPRESSION: No acute cardiopulmonary disease. Electronically Signed   By: Feliberto HartsFrederick S Jones MD   On: 05/10/2020 11:24   CT Angio Chest PE W/Cm &/Or Wo Cm  Result Date: 05/10/2020 CLINICAL DATA:  Recent knee surgery. Chest and rib pain. Facial rash. EXAM: CT ANGIOGRAPHY CHEST WITH CONTRAST TECHNIQUE: Multidetector CT imaging of the chest was performed using the standard protocol during bolus administration of intravenous contrast. Multiplanar CT image reconstructions and MIPs were obtained to evaluate the vascular anatomy. CONTRAST:  125mL OMNIPAQUE IOHEXOL 350 MG/ML SOLN COMPARISON:  Chest radiograph from earlier today. FINDINGS: Cardiovascular: The study is low quality for the evaluation of pulmonary embolism, limited by motion and poor bolus opacification. There are no convincing filling defects in the central, lobar, segmental or subsegmental pulmonary artery branches to suggest acute pulmonary embolism. Normal course and caliber of the thoracic aorta. Top-normal caliber main pulmonary artery (3.1 cm diameter). Top-normal heart size. No significant pericardial fluid/thickening. Mediastinum/Nodes: No discrete thyroid nodules. Unremarkable esophagus. No pathologically enlarged axillary, mediastinal or  hilar lymph nodes. Lungs/Pleura: No pneumothorax. No pleural effusion. No acute consolidative airspace disease, lung masses or significant pulmonary nodules. Upper abdomen: Postsurgical changes from sleeve gastrectomy. Musculoskeletal: No aggressive appearing focal osseous lesions. Mild thoracic spondylosis. Review of the MIP images confirms the above findings. IMPRESSION: Limited scan. No evidence of pulmonary embolism. No active pulmonary disease. Electronically Signed   By: Delbert PhenixJason A Poff M.D.   On: 05/10/2020 13:44    Procedures Procedures (including critical care time)  Medications Ordered in ED Medications  iohexol (OMNIPAQUE) 350 MG/ML injection 100 mL (125 mLs Intravenous Contrast Given 05/10/20 1304)    ED Course  I have reviewed the triage vital signs and the nursing notes.  Pertinent labs & imaging results that were available during my care of the patient were reviewed by me and considered in my medical decision making (see chart for details).  Clinical Course as of May 10 1444  Wed May 10, 2020  1435 IMPRESSION: Limited scan. No evidence of pulmonary embolism. No active pulmonary disease.  CT Angio Chest PE W/Cm &/Or Wo Cm [MV]    Clinical Course User Index [MV] Tanda RockersVenter, Aretta Stetzel, PA-C   MDM Rules/Calculators/A&P                          34 year old male who presents to the ED today complaining of diffuse chest pain that began shortly after waking up from surgery 2 days ago.  He had right knee arthroscopy with lateral meniscal debridement performed by Dr. Despina HickAlusio.  Ports he was sent to the ED for further evaluation with concern for possible PE.  He states he is having pain with deep inspiration as well.  Patient also has other complaints including some edema to his face around his eyes, subconjunctival hemorrhage bilaterally, sore throat.  Uvula is midline there is no posterior oropharyngeal edema on exam.  Patient is tolerating his own secretions without difficulty.  Suspect his  sore throat is related to intubation from his surgery 2 days ago.  On exam patient does have subconjunctival hemorrhage to both eyes however very mild.  No pain with extraocular movements.  He denies any visual disturbances.  Noted to have some edema periorbitally bilaterally.  Patient has had surgery in the  past without any issues/complications related to anesthesia.  He has not taken any Benadryl for the swelling.  He does not think there were any complications during surgery and there are none mention per chart review however difficult to say why patient is having chest pain specifically right after waking up from surgery.  Given his recent surgery there is concern for possible PE and will obtain CT a at this time.  Do not feel dimer would be beneficial at this time as I suspect it will be elevated secondary to recent surgery.   EKG without acute ischemic changes today. X-ray negative. CBC without leukocytosis.  Hemoglobin stable at 13.0. BMP with a glucose 130.  No other findings.  Stable creatinine at 1.11. Troponin 4  CTA limited s/2 poor bolus opacification but no obvious large PE identified. In the setting of pain immediately after surgery and reproducible chest wall TTP I have low suspicion for this. Vitals also do not reflect a PE at this time.   Repeat troponin of 4.   Labwork essentially reassuring at this time. Will plan to dispo patient home. Suspect musculoskeletal chest pain at this time; he is currently taking Norco for pain. Advised to continue. Advised for benadryl daily for the periorbital swelling; suspect some sort of allergic reaction. No signs concerning for anaphylaxis at this time. Pt advised to follow up with PCP as they sent him here to rule out PE. Strict return precautions discussed. He is in agreement with plan and stable for discharge home.   This note was prepared using Dragon voice recognition software and may include unintentional dictation errors due to the inherent  limitations of voice recognition software.   Final Clinical Impression(s) / ED Diagnoses Final diagnoses:  Nonspecific chest pain  Subconjunctival hemorrhage of both eyes  Allergic reaction, initial encounter  S/P arthroscopic knee surgery    Rx / DC Orders ED Discharge Orders    None       Discharge Instructions     Your labwork and imaging were reassuring today. There were no signs of obvious blood clots in your lungs at this time. I would recommend continuing using your pain medication as needed and to follow up with your PCP.   I would recommend Benadryl daily for the swelling of your face. You may be experiencing an allergic reaction related to your recent surgery.   Keep appointment as scheduled with Dr. Despina Hick on 10/19.   Return to the ED IMMEDIATELY for any worsening symptoms including worsening pain, shortness of breath, coughing up blood, passing out, throat swelling, wheezing, hives, eye pain, blurry/double vision, or any other new/concerning symptoms.        Tanda Rockers, PA-C 05/10/20 1445    Sabas Sous, MD 05/10/20 [phone]

## 2020-06-28 ENCOUNTER — Emergency Department (HOSPITAL_BASED_OUTPATIENT_CLINIC_OR_DEPARTMENT_OTHER): Payer: 59

## 2020-06-28 ENCOUNTER — Other Ambulatory Visit: Payer: Self-pay

## 2020-06-28 ENCOUNTER — Inpatient Hospital Stay (HOSPITAL_BASED_OUTPATIENT_CLINIC_OR_DEPARTMENT_OTHER)
Admission: EM | Admit: 2020-06-28 | Discharge: 2020-07-02 | DRG: 177 | Disposition: A | Discharge status: Home / Self Care | Payer: 59 | Attending: Internal Medicine | Admitting: Internal Medicine

## 2020-06-28 ENCOUNTER — Encounter (HOSPITAL_BASED_OUTPATIENT_CLINIC_OR_DEPARTMENT_OTHER): Payer: Self-pay | Admitting: Emergency Medicine

## 2020-06-28 DIAGNOSIS — Z91010 Allergy to peanuts: Secondary | ICD-10-CM

## 2020-06-28 DIAGNOSIS — Z885 Allergy status to narcotic agent status: Secondary | ICD-10-CM | POA: Diagnosis not present

## 2020-06-28 DIAGNOSIS — T380X5A Adverse effect of glucocorticoids and synthetic analogues, initial encounter: Secondary | ICD-10-CM | POA: Diagnosis not present

## 2020-06-28 DIAGNOSIS — J1282 Pneumonia due to coronavirus disease 2019: Secondary | ICD-10-CM | POA: Diagnosis present

## 2020-06-28 DIAGNOSIS — R197 Diarrhea, unspecified: Secondary | ICD-10-CM

## 2020-06-28 DIAGNOSIS — Z9119 Patient's noncompliance with other medical treatment and regimen: Secondary | ICD-10-CM

## 2020-06-28 DIAGNOSIS — R0602 Shortness of breath: Secondary | ICD-10-CM | POA: Diagnosis not present

## 2020-06-28 DIAGNOSIS — Z79899 Other long term (current) drug therapy: Secondary | ICD-10-CM | POA: Diagnosis not present

## 2020-06-28 DIAGNOSIS — R0902 Hypoxemia: Secondary | ICD-10-CM | POA: Diagnosis not present

## 2020-06-28 DIAGNOSIS — U071 COVID-19: Principal | ICD-10-CM | POA: Diagnosis present

## 2020-06-28 DIAGNOSIS — Y92239 Unspecified place in hospital as the place of occurrence of the external cause: Secondary | ICD-10-CM | POA: Diagnosis not present

## 2020-06-28 DIAGNOSIS — R739 Hyperglycemia, unspecified: Secondary | ICD-10-CM | POA: Diagnosis not present

## 2020-06-28 DIAGNOSIS — K219 Gastro-esophageal reflux disease without esophagitis: Secondary | ICD-10-CM | POA: Diagnosis present

## 2020-06-28 DIAGNOSIS — Z6841 Body Mass Index (BMI) 40.0 and over, adult: Secondary | ICD-10-CM

## 2020-06-28 DIAGNOSIS — Z91012 Allergy to eggs: Secondary | ICD-10-CM

## 2020-06-28 DIAGNOSIS — R112 Nausea with vomiting, unspecified: Secondary | ICD-10-CM | POA: Diagnosis not present

## 2020-06-28 DIAGNOSIS — E662 Morbid (severe) obesity with alveolar hypoventilation: Secondary | ICD-10-CM | POA: Diagnosis present

## 2020-06-28 DIAGNOSIS — J9601 Acute respiratory failure with hypoxia: Secondary | ICD-10-CM | POA: Diagnosis present

## 2020-06-28 DIAGNOSIS — E871 Hypo-osmolality and hyponatremia: Secondary | ICD-10-CM | POA: Diagnosis present

## 2020-06-28 LAB — BASIC METABOLIC PANEL
Anion gap: 11 (ref 5–15)
BUN: 13 mg/dL (ref 6–20)
CO2: 22 mmol/L (ref 22–32)
Calcium: 8.1 mg/dL — ABNORMAL LOW (ref 8.9–10.3)
Chloride: 100 mmol/L (ref 98–111)
Creatinine, Ser: 1.24 mg/dL (ref 0.61–1.24)
GFR, Estimated: >60 mL/min (ref >60)
Glucose, Bld: 137 mg/dL — ABNORMAL HIGH (ref 70–99)
Potassium: 3.8 mmol/L (ref 3.5–5.1)
Sodium: 133 mmol/L — ABNORMAL LOW (ref 135–145)

## 2020-06-28 LAB — CBC WITH DIFFERENTIAL/PLATELET
Abs Immature Granulocytes: 0.04 10*3/uL (ref 0.00–0.07)
Basophils Absolute: 0 10*3/uL (ref 0.0–0.1)
Basophils Relative: 0 %
Eosinophils Absolute: 0 10*3/uL (ref 0.0–0.5)
Eosinophils Relative: 0 %
HCT: 36.7 % — ABNORMAL LOW (ref 39.0–52.0)
Hemoglobin: 12.1 g/dL — ABNORMAL LOW (ref 13.0–17.0)
Immature Granulocytes: 1 %
Lymphocytes Relative: 19 %
Lymphs Abs: 1.2 10*3/uL (ref 0.7–4.0)
MCH: 27.8 pg (ref 26.0–34.0)
MCHC: 33 g/dL (ref 30.0–36.0)
MCV: 84.2 fL (ref 80.0–100.0)
Monocytes Absolute: 0.4 10*3/uL (ref 0.1–1.0)
Monocytes Relative: 6 %
Neutro Abs: 4.5 10*3/uL (ref 1.7–7.7)
Neutrophils Relative %: 74 %
Platelets: 170 10*3/uL (ref 150–400)
RBC: 4.36 MIL/uL (ref 4.22–5.81)
RDW: 15.3 % (ref 11.5–15.5)
WBC: 6.2 10*3/uL (ref 4.0–10.5)
nRBC: 0 % (ref 0.0–0.2)

## 2020-06-28 LAB — GLUCOSE, CAPILLARY
Glucose-Capillary: 123 mg/dL — ABNORMAL HIGH (ref 70–99)
Glucose-Capillary: 129 mg/dL — ABNORMAL HIGH (ref 70–99)

## 2020-06-28 LAB — C-REACTIVE PROTEIN: CRP: 8.7 mg/dL — ABNORMAL HIGH (ref <1.0)

## 2020-06-28 LAB — LACTIC ACID, PLASMA: Lactic Acid, Venous: 1.3 mmol/L (ref 0.5–1.9)

## 2020-06-28 LAB — FERRITIN: Ferritin: 277 ng/mL (ref 24–336)

## 2020-06-28 LAB — PROCALCITONIN: Procalcitonin: <0.1 ng/mL

## 2020-06-28 LAB — D-DIMER, QUANTITATIVE: D-Dimer, Quant: 0.74 ug/mL-FEU — ABNORMAL HIGH (ref 0.00–0.50)

## 2020-06-28 LAB — RESP PANEL BY RT-PCR (FLU A&B, COVID) ARPGX2
Influenza A by PCR: NEGATIVE
Influenza B by PCR: NEGATIVE
SARS Coronavirus 2 by RT PCR: POSITIVE — AB

## 2020-06-28 LAB — HEMOGLOBIN A1C
Hgb A1c MFr Bld: 5.9 % — ABNORMAL HIGH (ref 4.8–5.6)
Mean Plasma Glucose: 122.63 mg/dL

## 2020-06-28 MED ORDER — ALBUTEROL SULFATE HFA 108 (90 BASE) MCG/ACT IN AERS
2.0000 | INHALATION_SPRAY | RESPIRATORY_TRACT | Status: DC
  Administered 2020-06-28 (×4): 2 via RESPIRATORY_TRACT
  Filled 2020-06-28 (×2): qty 6.7

## 2020-06-28 MED ORDER — ACETAMINOPHEN 325 MG PO TABS: 650.0000 mg | ORAL_TABLET | Freq: Four times a day (QID) | ORAL | Status: DC | PRN

## 2020-06-28 MED ORDER — ZINC SULFATE 220 (50 ZN) MG PO CAPS
220.0000 mg | ORAL_CAPSULE | Freq: Every day | ORAL | Status: DC
  Administered 2020-06-28 – 2020-07-02 (×5): 220 mg via ORAL
  Filled 2020-06-28 (×5): qty 1

## 2020-06-28 MED ORDER — SODIUM CHLORIDE 0.9 % IV SOLN
100.0000 mg | Freq: Every day | INTRAVENOUS | Status: DC
  Administered 2020-06-29 – 2020-07-02 (×4): 100 mg via INTRAVENOUS
  Filled 2020-06-28 (×4): qty 20

## 2020-06-28 MED ORDER — ONDANSETRON HCL 4 MG PO TABS: 4.0000 mg | ORAL_TABLET | Freq: Four times a day (QID) | ORAL | Status: DC | PRN

## 2020-06-28 MED ORDER — ACETAMINOPHEN 325 MG PO TABS: 650.0000 mg | ORAL_TABLET | Freq: Once | ORAL | Status: AC

## 2020-06-28 MED ORDER — GUAIFENESIN-DM 100-10 MG/5ML PO SYRP
10.0000 mL | ORAL_SOLUTION | ORAL | Status: DC | PRN
  Administered 2020-07-02: 10 mL via ORAL
  Filled 2020-06-28: qty 10

## 2020-06-28 MED ORDER — ALBUTEROL SULFATE HFA 108 (90 BASE) MCG/ACT IN AERS
2.0000 | INHALATION_SPRAY | RESPIRATORY_TRACT | Status: DC | PRN
  Administered 2020-07-02: 2 via RESPIRATORY_TRACT
  Filled 2020-06-28: qty 6.7

## 2020-06-28 MED ORDER — IPRATROPIUM-ALBUTEROL 20-100 MCG/ACT IN AERS
1.0000 | INHALATION_SPRAY | Freq: Four times a day (QID) | RESPIRATORY_TRACT | Status: DC
  Administered 2020-06-28 – 2020-07-02 (×14): 1 via RESPIRATORY_TRACT
  Filled 2020-06-28: qty 4

## 2020-06-28 MED ORDER — ZINC 30 MG PO TABS: 30.0000 mg | ORAL_TABLET | Freq: Every day | ORAL | Status: DC

## 2020-06-28 MED ORDER — HYDROCOD POLST-CPM POLST ER 10-8 MG/5ML PO SUER
5.0000 mL | Freq: Two times a day (BID) | ORAL | Status: DC | PRN
  Administered 2020-06-29: 5 mL via ORAL
  Filled 2020-06-28: qty 5

## 2020-06-28 MED ORDER — VITAMIN D 25 MCG (1000 UNIT) PO TABS
2000.0000 [IU] | ORAL_TABLET | Freq: Every day | ORAL | Status: DC
  Administered 2020-06-28 – 2020-07-02 (×5): 2000 [IU] via ORAL
  Filled 2020-06-28 (×5): qty 2

## 2020-06-28 MED ORDER — ENOXAPARIN SODIUM 100 MG/ML ~~LOC~~ SOLN
0.5000 mg/kg | SUBCUTANEOUS | Status: DC
  Administered 2020-06-28 – 2020-06-30 (×3): 100 mg via SUBCUTANEOUS
  Filled 2020-06-28 (×3): qty 1

## 2020-06-28 MED ORDER — DEXAMETHASONE SODIUM PHOSPHATE 10 MG/ML IJ SOLN
6.0000 mg | Freq: Once | INTRAMUSCULAR | Status: AC
  Administered 2020-06-28: 6 mg via INTRAVENOUS
  Filled 2020-06-28: qty 1

## 2020-06-28 MED ORDER — SODIUM CHLORIDE 0.9 % IV SOLN
100.0000 mg | INTRAVENOUS | Status: AC
  Administered 2020-06-28 (×2): 100 mg via INTRAVENOUS
  Filled 2020-06-28 (×2): qty 20

## 2020-06-28 MED ORDER — ASCORBIC ACID 500 MG PO TABS
500.0000 mg | ORAL_TABLET | Freq: Every day | ORAL | Status: DC
  Administered 2020-06-28 – 2020-07-02 (×5): 500 mg via ORAL
  Filled 2020-06-28 (×5): qty 1

## 2020-06-28 MED ORDER — ACETAMINOPHEN 325 MG PO TABS
ORAL_TABLET | ORAL | Status: AC
  Administered 2020-06-28: 650 mg via ORAL
  Filled 2020-06-28: qty 2

## 2020-06-28 MED ORDER — ENOXAPARIN SODIUM 100 MG/ML ~~LOC~~ SOLN
0.5000 mg/kg | Freq: Once | SUBCUTANEOUS | Status: AC
  Administered 2020-06-28: 95 mg via SUBCUTANEOUS
  Filled 2020-06-28: qty 1

## 2020-06-28 MED ORDER — DEXAMETHASONE SODIUM PHOSPHATE 10 MG/ML IJ SOLN
6.0000 mg | INTRAMUSCULAR | Status: DC
  Administered 2020-06-28: 6 mg via INTRAVENOUS
  Filled 2020-06-28: qty 1

## 2020-06-28 MED ORDER — ONDANSETRON HCL 4 MG/2ML IJ SOLN
4.0000 mg | Freq: Four times a day (QID) | INTRAMUSCULAR | Status: DC | PRN
  Administered 2020-06-28: 4 mg via INTRAVENOUS
  Filled 2020-06-28: qty 2

## 2020-06-28 MED ORDER — ONDANSETRON HCL 4 MG/2ML IJ SOLN
4.0000 mg | Freq: Once | INTRAMUSCULAR | Status: AC
  Administered 2020-06-28: 4 mg via INTRAVENOUS
  Filled 2020-06-28: qty 2

## 2020-06-28 MED ORDER — ALBUTEROL SULFATE (2.5 MG/3ML) 0.083% IN NEBU: 5.0000 mg | INHALATION_SOLUTION | Freq: Once | RESPIRATORY_TRACT | Status: DC

## 2020-06-28 NOTE — ED Provider Notes (Signed)
[facility] Provider Note: Antonio Dell, MD, FACEP  CSN: [id number] MRN: [id number] ARRIVAL: 06/28/20 at 0324 ROOM: [facility]   CHIEF COMPLAINT  Shortness of Breath   HISTORY OF PRESENT ILLNESS  06/28/20 4:41 AM Antonio Conner is a 34 y.o. male with a history of morbid obesity (419 pounds, BMI 62) with 8 days of shortness of breath, cough, body aches and fever.  He started having nausea, vomiting and diarrhea yesterday evening.  His shortness of breath is also worsened.  He was noted to have an oxygen saturation of 90% on room air in triage which dropped to 80 per 6% after ambulation.  Dyspnea is moderate to severe and worse with exertion.  He has had a cough with this along with loss of taste and smell but no nasal congestion or sore throat.   Past Medical History:  Diagnosis Date  . Arthritis   . GERD (gastroesophageal reflux disease)   . Lateral meniscus tear right knee  . Morbid obesity (HCC)   . OSA (obstructive sleep apnea)    Not currently using CPAP    Past Surgical History:  Procedure Laterality Date  . KNEE ARTHROSCOPY Right 05/31/2019   Procedure: Right knee arthroscopy with lateral meniscal debridement;  Surgeon: Ollen Gross, MD;  Location: [facility];  Service: Orthopedics;  Laterality: Right;   . KNEE ARTHROSCOPY Right 05/08/2020   Procedure: Right knee arthroscopy, lateral meniscal debridement;  Surgeon: Ollen Gross, MD;  Location: [facility];  Service: Orthopedics;  Laterality: Right;   . LAPAROSCOPIC GASTRIC BAND REMOVAL WITH LAPAROSCOPIC GASTRIC SLEEVE RESECTION  03/2015  . WISDOM TOOTH EXTRACTION  2010    No family history on file.  Social History   Tobacco Use  . Smoking status: Never Smoker  . Smokeless tobacco: Never Used  Vaping Use  . Vaping Use: Never used  Substance Use Topics  . Alcohol use: No  . Drug use: No    Prior to Admission medications   Medication Sig Start Date End Date Taking? Authorizing Provider    ascorbic acid (VITAMIN C) 500 MG tablet Take 500 mg by mouth daily.    [provider]  Cholecalciferol (VITAMIN D3) 50 MCG (2000 UT) TABS Take 2,000 Units by mouth daily.    [provider]  HYDROcodone-acetaminophen (NORCO/VICODIN) 5-325 MG tablet Take 1 tablet by mouth every 6 (six) hours as needed for moderate pain. 05/08/20 05/08/21  Edmisten, Kristie L, PA  magnesium 30 MG tablet Take 30 mg by mouth daily.    [provider]  methocarbamol (ROBAXIN) 500 MG tablet Take 1 tablet (500 mg total) by mouth every 6 (six) hours as needed for muscle spasms. 05/08/20   Edmisten, Kristie L, PA  Zinc 30 MG TABS Take 30 mg by mouth daily.    [provider]    Allergies Hibiclens  [chlorhexidine gluconate], Eggs or egg-derived products, Lac bovis, Peanut oil, Influenza vaccines, and Morphine and related   REVIEW OF SYSTEMS  Negative except as noted here or in the History of Present Illness.   PHYSICAL EXAMINATION  Initial Vital Signs Blood pressure 129/81, pulse (!) 109, temperature (!) 100.6 F (38.1 C), temperature source Oral, resp. rate (!) 21, height 5\' 9"  (1.753 m), weight (!) 190.5 kg, SpO2 90 %.  Examination General: Well-developed, obese male in no acute distress; appearance consistent with age of record HENT: normocephalic; atraumatic Eyes: pupils equal, round and reactive to light; extraocular muscles intact Neck: supple Heart: regular rate and  rhythm; tachycardia Lungs: clear to auscultation bilaterally; tachypnea Abdomen: soft; nondistended; nontender; bowel sounds present Extremities: No deformity; full range of motion; pulses normal Neurologic: Awake, alert and oriented; motor function intact in all extremities and symmetric; no facial droop Skin: Warm and dry Psychiatric: Normal mood and affect   RESULTS  Summary of this visit's results, reviewed and interpreted by myself:   EKG Interpretation  Date/Time:  Wednesday June 28 2020 03:42:24 EST Ventricular Rate:  106 PR Interval:    QRS Duration: 83 QT Interval:  327 QTC Calculation: 435 R Axis:   -14 Text Interpretation: Sinus tachycardia Ventricular premature complex No significant change was found Confirmed by Iver Miklas, Jonny Ruiz ([id number]) on 06/28/2020 3:51:11 AM      Laboratory Studies: Results for orders placed or performed during the hospital encounter of 06/28/20 (from the past 24 hour(s))  Resp Panel by RT-PCR (Flu A&B, Covid) Nasopharyngeal Swab     Status: Abnormal   Collection Time: 06/28/20  3:52 AM   Specimen: Nasopharyngeal Swab; Nasopharyngeal(NP) swabs in vial transport medium  Result Value Ref Range   SARS Coronavirus 2 by RT PCR POSITIVE (A) NEGATIVE   Influenza A by PCR NEGATIVE NEGATIVE   Influenza B by PCR NEGATIVE NEGATIVE  CBC with Differential/Platelet     Status: Abnormal   Collection Time: 06/28/20  4:18 AM  Result Value Ref Range   WBC 6.2 4.0 - 10.5 K/uL   RBC 4.36 4.22 - 5.81 MIL/uL   Hemoglobin 12.1 (L) 13.0 - 17.0 g/dL   HCT 66.4 (L) 39 - 52 %   MCV 84.2 80.0 - 100.0 fL   MCH 27.8 26.0 - 34.0 pg   MCHC 33.0 30.0 - 36.0 g/dL   RDW 40.3 47.4 - 25.9 %   Platelets 170 150 - 400 K/uL   nRBC 0.0 0.0 - 0.2 %   Neutrophils Relative % 74 %   Neutro Abs 4.5 1.7 - 7.7 K/uL   Lymphocytes Relative 19 %   Lymphs Abs 1.2 0.7 - 4.0 K/uL   Monocytes Relative 6 %   Monocytes Absolute 0.4 0.1 - 1.0 K/uL   Eosinophils Relative 0 %   Eosinophils Absolute 0.0 0.0 - 0.5 K/uL   Basophils Relative 0 %   Basophils Absolute 0.0 0.0 - 0.1 K/uL   Immature Granulocytes 1 %   Abs Immature Granulocytes 0.04 0.00 - 0.07 K/uL  Basic metabolic panel     Status: Abnormal   Collection Time: 06/28/20  4:18 AM  Result Value Ref Range   Sodium 133 (L) 135 - 145 mmol/L   Potassium 3.8 3.5 - 5.1 mmol/L   Chloride 100 98 - 111 mmol/L   CO2 22 22 - 32 mmol/L   Glucose, Bld 137 (H) 70 - 99 mg/dL   BUN 13 6 - 20 mg/dL   Creatinine, Ser 5.63 0.61 - 1.24 mg/dL     Calcium 8.1 (L) 8.9 - 10.3 mg/dL   GFR, Estimated >87 >56 mL/min   Anion gap 11 5 - 15  Lactic acid, plasma     Status: None   Collection Time: 06/28/20  4:40 AM  Result Value Ref Range   Lactic Acid, Venous 1.3 0.5 - 1.9 mmol/L   Imaging Studies: DG Chest 2 View  Result Date: 06/28/2020 CLINICAL DATA:  Dyspnea EXAM: CHEST - 2 VIEW COMPARISON:  05/10/2020 FINDINGS: Lung volumes are extremely small, but are symmetric. There is superimposed extensive patchy perihilar pulmonary infiltrate, likely infectious or inflammatory in the acute  setting. No pneumothorax or pleural effusion. Cardiac size within normal limits. IMPRESSION: Pulmonary hypoinflation. Interval development of extensive bilateral pulmonary infiltrates, likely infectious or inflammatory. Electronically Signed   By: Helyn Numbers MD   On: 06/28/2020 04:11    ED COURSE and MDM  Nursing notes, initial and subsequent vitals signs, including pulse oximetry, reviewed and interpreted by myself.  Vitals:   06/28/20 0339 06/28/20 0348 06/28/20 0400 06/28/20 0407  BP:    129/81  Pulse:    (!) 109  Resp:    (!) 21  Temp:  (!) 100.6 F (38.1 C)    TempSrc:  Oral    SpO2:   95% 90%  Weight: (!) 190.5 kg     Height: 5\' 9"  (1.753 m)      Medications  albuterol (VENTOLIN HFA) 108 (90 Base) MCG/ACT inhaler 2 puff (2 puffs Inhalation Given 06/28/20 0356)  ondansetron (ZOFRAN) injection 4 mg (has no administration in time range)   5:09 AM Oxygen saturation 96% on nasal cannula.  Have consulted pharmacy regarding current recommended inpatient Covid treatment guidelines.   5:18 AM Discussed with pharmacy recommends Lovenox 0.5 mg/kg SQ and dexamethasone 6mg  IV.  After discussion with Dr. 14/1/21 of the hospitalist service we will go ahead and start remdesivir.  He accepts the patient for admission to the hospitalist service.   PROCEDURES  Procedures CRITICAL CARE Performed by: Shandreka Dante Total critical care time: 30  minutes Critical care time was exclusive of separately billable procedures and treating other patients. Critical care was necessary to treat or prevent imminent or life-threatening deterioration. Critical care was time spent personally by me on the following activities: development of treatment plan with patient and/or surrogate as well as nursing, discussions with consultants, evaluation of patient's response to treatment, examination of patient, obtaining history from patient or surrogate, ordering and performing treatments and interventions, ordering and review of laboratory studies, ordering and review of radiographic studies, pulse oximetry and re-evaluation of patient's condition.   ED DIAGNOSES     ICD-10-CM   1. Pneumonia due to COVID-19 virus  U07.1    J12.82   2. Hypoxia  R09.02   3. Shortness of breath  R06.02   4. Nausea, vomiting and diarrhea  R11.2    R19.7        Dominyck Reser, Rachael Darby, MD 06/28/20 [phone]

## 2020-06-28 NOTE — Progress Notes (Signed)
Pt arrived on [address] [place] at 1700 via [place]. Pt alert and oriented x4 in no acute distress. Respirations even and unlabored on 3 L/min O2. PIV to LAC clean, dry, and intact, and patent. Vitals taken and assessment completed. Pt oriented to room and call bell system. Bed in low position. Call bell within reach.   Pt is ambulatory with standby 1 assist.

## 2020-06-28 NOTE — H&P (Signed)
History and Physical    Antonio Conner IOX:[id number] DOB: 1985-12-30 DOA: 06/28/2020  PCP: Jackie Plum, MD  Patient coming from: Home, transfers from med [facility]  I have personally briefly reviewed patient's old medical records in [facility]  Chief Complaint: Fever, shortness of breath, cough, nausea vomiting since 8 days  HPI: Antonio Conner is a 34 y.o. male with medical history significant of morbid obesity, obstructive sleep apnea, recently diagnosed lateral meniscal tear s/p knee surgery on the right, GERD reports having shortness of breath, fever since 8 days worsened yesterday.  Patient also reports cough with clear phlegm. Reports persistent nausea vomiting and diarrhea worsened the last 2 to 3 days.  He denies any hemoptysis.  He denies any syncope, chest pain pedal edema, dizziness or headache.  He was seen at [facility] , he was tested positive for COVID-19 and transferred to [facility] for evaluation and management of COVID-19 pneumonia. He is not vaccinated.  He reports all his family members have tested positive for Covid 19 including his vaccinated daughter. Chest x-ray showed Pulmonary hypoinflation. Interval development of extensive bilateral pulmonary infiltrates, likely infectious or inflammatory.  Blood cultures were drawn and are negative so far. EKG shows sinus tachycardia.   Review of Systems: As per HPI otherwise"All others reviewed and are negative,"  Past Medical History:  Diagnosis Date  . Arthritis   . GERD (gastroesophageal reflux disease)   . Lateral meniscus tear right knee  . Morbid obesity (HCC)   . OSA (obstructive sleep apnea)    Not currently using CPAP    Past Surgical History:  Procedure Laterality Date  . KNEE ARTHROSCOPY Right 05/31/2019   Procedure: Right knee arthroscopy with lateral meniscal debridement;  Surgeon: Ollen Gross, MD;  Location: [facility];  Service: Orthopedics;  Laterality: Right;   .  KNEE ARTHROSCOPY Right 05/08/2020   Procedure: Right knee arthroscopy, lateral meniscal debridement;  Surgeon: Ollen Gross, MD;  Location: [facility];  Service: Orthopedics;  Laterality: Right;   . LAPAROSCOPIC GASTRIC BAND REMOVAL WITH LAPAROSCOPIC GASTRIC SLEEVE RESECTION  03/2015  . WISDOM TOOTH EXTRACTION  2010    Social History  reports that he has never smoked. He has never used smokeless tobacco. He reports that he does not drink alcohol and does not use drugs.  Allergies  Allergen Reactions  . Hibiclens [Chlorhexidine Gluconate] Hives and Itching  . Lac Bovis Shortness Of Breath  . Eggs Or Egg-Derived Products Hives  . Peanut Oil Itching, Swelling and Other (See Comments)    Throat and ears itch & swell  . Influenza Vaccines Swelling and Other (See Comments)    Swelling of limbs  . Morphine And Related Nausea And Vomiting and Other (See Comments)    "Got sick to my stomach and threw up"    History reviewed. No pertinent family history. Family history positive for COVID-19 illness.  Prior to Admission medications   Medication Sig Start Date End Date Taking? Authorizing Provider  ascorbic acid (VITAMIN C) 500 MG tablet Take 500 mg by mouth daily.   Yes [provider]  Cholecalciferol (VITAMIN D3) 50 MCG (2000 UT) TABS Take 2,000 Units by mouth daily.   Yes [provider]  ibuprofen (ADVIL) 200 MG tablet Take 800 mg by mouth every 6 (six) hours as needed for fever, headache or mild pain.   Yes [provider]  magnesium 30 MG tablet Take 30 mg by mouth daily.   Yes [provider]  methocarbamol (ROBAXIN) 500 MG tablet Take 1 tablet (500 mg total) by mouth every 6 (six) hours as needed for muscle spasms. 05/08/20  Yes Edmisten, Kristie L, PA  VITAMIN E PO Take 1 capsule by mouth daily.   Yes [provider]  Zinc 30 MG TABS Take 30 mg by mouth daily.   Yes [provider]  HYDROcodone-acetaminophen (NORCO/VICODIN)  5-325 MG tablet Take 1 tablet by mouth every 6 (six) hours as needed for moderate pain. Patient not taking: Reported on 06/28/2020 05/08/20 05/08/21  Derenda Fennel, [place]    Physical Exam: Vitals:   06/28/20 1500 06/28/20 1530 06/28/20 1610 06/28/20 1700  BP: (!) 145/125 (!) 142/87 140/88 (!) 152/103  Pulse: 98 89 84 89  Resp: (!) 22 (!) 22 (!) 22 17  Temp:   99.2 F (37.3 C) 98.9 F (37.2 C)  TempSrc:   Oral Oral  SpO2: 92% 100% 94% 96%  Weight:    (!) 201 kg  Height:    5\' 9"  (1.753 m)    Constitutional: NAD, calm, comfortable Vitals:   06/28/20 1500 06/28/20 1530 06/28/20 1610 06/28/20 1700  BP: (!) 145/125 (!) 142/87 140/88 (!) 152/103  Pulse: 98 89 84 89  Resp: (!) 22 (!) 22 (!) 22 17  Temp:   99.2 F (37.3 C) 98.9 F (37.2 C)  TempSrc:   Oral Oral  SpO2: 92% 100% 94% 96%  Weight:    (!) 201 kg  Height:    5\' 9"  (1.753 m)   Eyes: PERRL, lids and conjunctivae normal ENMT: Mucous membranes are moist. Posterior pharynx clear of any exudate or lesions.Normal dentition.  Neck: normal, supple, no masses, no thyromegaly Respiratory: Managed air entry throughout the lungs, no wheezing or crackles, tachypnea present, on 3 L of nasal cannula oxygen. Cardiovascular: Regular rate and rhythm, no murmurs / rubs / gallops. No extremity edema. 2+ pedal pulses. No carotid bruits.  Abdomen: no tenderness, no masses palpated.  Bowel sounds positive.  Musculoskeletal: no clubbing / cyanosis. No joint deformity upper and lower extremities. Good ROM, no contractures. Normal muscle tone.  Skin: no rashes, lesions, ulcers. No induration Neurologic: CN 2-12 grossly intact. Sensation intact, DTR normal. Strength 5/5 in all 4.  Psychiatric: Normal judgment and insight. Alert and oriented x 3. Normal mood.    Labs on Admission: I have personally reviewed following labs and imaging studies  CBC: Recent Labs  Lab 06/28/20 0418  WBC 6.2  NEUTROABS 4.5  HGB 12.1*  HCT 36.7*  MCV 84.2    PLT 170    Basic Metabolic Panel: Recent Labs  Lab 06/28/20 0418  NA 133*  K 3.8  CL 100  CO2 22  GLUCOSE 137*  BUN 13  CREATININE 1.24  CALCIUM 8.1*    GFR: Estimated Creatinine Clearance: 144.4 mL/min (by C-G formula based on SCr of 1.24 mg/dL).  Liver Function Tests: No results for input(s): AST, ALT, ALKPHOS, BILITOT, PROT, ALBUMIN in the last 168 hours.  Urine analysis: No results found for: COLORURINE, APPEARANCEUR, LABSPEC, PHURINE, GLUCOSEU, HGBUR, BILIRUBINUR, KETONESUR, PROTEINUR, UROBILINOGEN, NITRITE, LEUKOCYTESUR  Radiological Exams on Admission: DG Chest 2 View  Result Date: 06/28/2020 CLINICAL DATA:  Dyspnea EXAM: CHEST - 2 VIEW COMPARISON:  05/10/2020 FINDINGS: Lung volumes are extremely small, but are symmetric. There is superimposed extensive patchy perihilar pulmonary infiltrate, likely infectious or inflammatory in the acute setting. No pneumothorax or pleural effusion. Cardiac size within normal limits. IMPRESSION: Pulmonary hypoinflation. Interval development of  extensive bilateral pulmonary infiltrates, likely infectious or inflammatory. Electronically Signed   By: Helyn Numbers MD   On: 06/28/2020 04:11     Assessment/Plan Active Problems:   Pneumonia due to COVID-19 virus    Acute respiratory failure with hypoxia requiring about 3 L of nasal cannula oxygen secondary to pneumonia due to COVID-19 virus Start the patient on remdesivir, Decadron, inhalers, cough medication Continue to follow inflammatory markers daily. Ordered ferritin, D-dimer, CRP. Follow blood cultures.    Hyperglycemia Check hemoglobin A1c.   Body mass index is 65.44 kg/m. Morbid obesity Poor prognostic factor.   Mild hyponatremia Continue to monitor.      DVT prophylaxis: Lovenox Code Status:   Full code Family Communication:  None at bedside  disposition Plan:   Patient is from:  Home  Anticipated DC to:  Home  Anticipated DC  date:  07/03/2020  Anticipated DC barriers: Resolution of shortness of breath and hypoxia Consults called:  None Admission status:  Inpatient, medical floor Severity of Illness: The appropriate patient status for this patient is INPATIENT. Inpatient status is judged to be reasonable and necessary in order to provide the required intensity of service to ensure the patient's safety. The patient's presenting symptoms, physical exam findings, and initial radiographic and laboratory data in the context of their chronic comorbidities is felt to place them at high risk for further clinical deterioration. Furthermore, it is not anticipated that the patient will be medically stable for discharge from the hospital within 2 midnights of admission.   * I certify that at the point of admission it is my clinical judgment that the patient will require inpatient hospital care spanning beyond 2 midnights from the point of admission due to high intensity of service, high risk for further deterioration and high frequency of surveillance required.*     Kathlen Mody MD [facility]  How to contact the [facility] Attending or Consulting provider 7A - 7P or covering provider during after hours 7P -7A, for this patient?   1. Check the care team in [facility] and look for a) attending/consulting [facility] provider listed and b) the [facility] team listed 2. Log into [email or web address] and use [facility] universal password to access. If you do not have the password, please contact the hospital operator. 3. Locate the [facility] provider you are looking for under [facility] and page to a number that you can be directly reached. 4. If you still have difficulty reaching the provider, please page the [facility] (Director on Call) for the Hospitalists listed on amion for assistance.  06/28/2020, 6:56 PM

## 2020-06-28 NOTE — ED Triage Notes (Signed)
Pt reports SHOB, cough, body aches, and fever for 8 days; pt reports N/V/D starting this evening; pt denies chest pain; pt took tylenol @ 1800; tried to ambulate pt from triage, oxygen dropped from 90 to 86% on room air; pt dyspneic at rest

## 2020-06-29 DIAGNOSIS — R0902 Hypoxemia: Secondary | ICD-10-CM | POA: Diagnosis not present

## 2020-06-29 DIAGNOSIS — U071 COVID-19: Secondary | ICD-10-CM | POA: Diagnosis not present

## 2020-06-29 DIAGNOSIS — J1282 Pneumonia due to coronavirus disease 2019: Secondary | ICD-10-CM | POA: Diagnosis not present

## 2020-06-29 LAB — CBC WITH DIFFERENTIAL/PLATELET
Abs Immature Granulocytes: 0.02 10*3/uL (ref 0.00–0.07)
Basophils Absolute: 0 10*3/uL (ref 0.0–0.1)
Basophils Relative: 0 %
Eosinophils Absolute: 0 10*3/uL (ref 0.0–0.5)
Eosinophils Relative: 0 %
HCT: 36.8 % — ABNORMAL LOW (ref 39.0–52.0)
Hemoglobin: 11.8 g/dL — ABNORMAL LOW (ref 13.0–17.0)
Immature Granulocytes: 0 %
Lymphocytes Relative: 12 %
Lymphs Abs: 0.6 10*3/uL — ABNORMAL LOW (ref 0.7–4.0)
MCH: 27.3 pg (ref 26.0–34.0)
MCHC: 32.1 g/dL (ref 30.0–36.0)
MCV: 85 fL (ref 80.0–100.0)
Monocytes Absolute: 0.3 10*3/uL (ref 0.1–1.0)
Monocytes Relative: 6 %
Neutro Abs: 4.3 10*3/uL (ref 1.7–7.7)
Neutrophils Relative %: 82 %
Platelets: 181 10*3/uL (ref 150–400)
RBC: 4.33 MIL/uL (ref 4.22–5.81)
RDW: 15.3 % (ref 11.5–15.5)
WBC: 5.3 10*3/uL (ref 4.0–10.5)
nRBC: 0 % (ref 0.0–0.2)

## 2020-06-29 LAB — COMPREHENSIVE METABOLIC PANEL
ALT: 33 U/L (ref 0–44)
AST: 50 U/L — ABNORMAL HIGH (ref 15–41)
Albumin: 3.3 g/dL — ABNORMAL LOW (ref 3.5–5.0)
Alkaline Phosphatase: 56 U/L (ref 38–126)
Anion gap: 11 (ref 5–15)
BUN: 12 mg/dL (ref 6–20)
CO2: 25 mmol/L (ref 22–32)
Calcium: 8.6 mg/dL — ABNORMAL LOW (ref 8.9–10.3)
Chloride: 103 mmol/L (ref 98–111)
Creatinine, Ser: 1.07 mg/dL (ref 0.61–1.24)
GFR, Estimated: >60 mL/min (ref >60)
Glucose, Bld: 136 mg/dL — ABNORMAL HIGH (ref 70–99)
Potassium: 4.2 mmol/L (ref 3.5–5.1)
Sodium: 139 mmol/L (ref 135–145)
Total Bilirubin: 0.4 mg/dL (ref 0.3–1.2)
Total Protein: 7.4 g/dL (ref 6.5–8.1)

## 2020-06-29 LAB — FERRITIN: Ferritin: 232 ng/mL (ref 24–336)

## 2020-06-29 LAB — MAGNESIUM: Magnesium: 2.2 mg/dL (ref 1.7–2.4)

## 2020-06-29 LAB — GLUCOSE, CAPILLARY
Glucose-Capillary: 131 mg/dL — ABNORMAL HIGH (ref 70–99)
Glucose-Capillary: 146 mg/dL — ABNORMAL HIGH (ref 70–99)
Glucose-Capillary: 153 mg/dL — ABNORMAL HIGH (ref 70–99)
Glucose-Capillary: 137 mg/dL — ABNORMAL HIGH (ref 70–99)
Glucose-Capillary: 143 mg/dL — ABNORMAL HIGH (ref 70–99)

## 2020-06-29 LAB — HIV ANTIBODY (ROUTINE TESTING W REFLEX): HIV Screen 4th Generation wRfx: NONREACTIVE

## 2020-06-29 LAB — D-DIMER, QUANTITATIVE: D-Dimer, Quant: 0.78 ug/mL-FEU — ABNORMAL HIGH (ref 0.00–0.50)

## 2020-06-29 LAB — PHOSPHORUS: Phosphorus: 2.7 mg/dL (ref 2.5–4.6)

## 2020-06-29 LAB — C-REACTIVE PROTEIN: CRP: 7.9 mg/dL — ABNORMAL HIGH (ref <1.0)

## 2020-06-29 MED ORDER — TOCILIZUMAB 400 MG/20ML IV SOLN
800.0000 mg | Freq: Once | INTRAVENOUS | Status: AC
  Administered 2020-06-29: 800 mg via INTRAVENOUS
  Filled 2020-06-29: qty 40

## 2020-06-29 MED ORDER — MELATONIN 5 MG PO TABS
5.0000 mg | ORAL_TABLET | Freq: Every day | ORAL | Status: DC
  Administered 2020-06-29 – 2020-07-01 (×3): 5 mg via ORAL
  Filled 2020-06-29 (×4): qty 1

## 2020-06-29 MED ORDER — METHYLPREDNISOLONE SODIUM SUCC 125 MG IJ SOLR
100.0000 mg | Freq: Two times a day (BID) | INTRAMUSCULAR | Status: DC
  Administered 2020-06-29 – 2020-06-30 (×4): 100 mg via INTRAVENOUS
  Filled 2020-06-29 (×4): qty 2

## 2020-06-29 MED ORDER — INSULIN ASPART 100 UNIT/ML ~~LOC~~ SOLN
0.0000 [IU] | Freq: Three times a day (TID) | SUBCUTANEOUS | Status: DC
  Administered 2020-06-29 – 2020-07-02 (×8): 2 [IU] via SUBCUTANEOUS

## 2020-06-29 MED ORDER — FAMOTIDINE 20 MG PO TABS
20.0000 mg | ORAL_TABLET | Freq: Every day | ORAL | Status: DC
  Administered 2020-06-29 – 2020-07-02 (×4): 20 mg via ORAL
  Filled 2020-06-29 (×4): qty 1

## 2020-06-29 MED ORDER — PANTOPRAZOLE SODIUM 40 MG PO TBEC
40.0000 mg | DELAYED_RELEASE_TABLET | Freq: Every day | ORAL | Status: DC
  Administered 2020-06-29 – 2020-07-02 (×4): 40 mg via ORAL
  Filled 2020-06-29 (×4): qty 1

## 2020-06-29 NOTE — Plan of Care (Signed)
  Problem: Respiratory: Goal: Will maintain a patent airway Outcome: Progressing Goal: Complications related to the disease process, condition or treatment will be avoided or minimized Outcome: Progressing   Problem: Education: Goal: Knowledge of General Education information will improve Description: Including pain rating scale, medication(s)/side effects and non-pharmacologic comfort measures Outcome: Progressing   Problem: Health Behavior/Discharge Planning: Goal: Ability to manage health-related needs will improve Outcome: Progressing   Problem: Clinical Measurements: Goal: Ability to maintain clinical measurements within normal limits will improve Outcome: Progressing   Problem: Coping: Goal: Level of anxiety will decrease Outcome: Progressing

## 2020-06-29 NOTE — Progress Notes (Signed)
PROGRESS NOTE                                                                                                                                                                                                             Patient Demographics:    Antonio Conner, is a 34 y.o. male, DOB - August 16, 1985, BMW:[id number]  Outpatient Primary MD for the patient is Jackie Plum, MD   Admit date - 06/28/2020   LOS - 1  Chief Complaint  Patient presents with  . Shortness of Breath       Brief Narrative: Patient is a 34 y.o. male with PMHx of OSA-noncompliant to CPAP, morbid obesity-presenting with approximately 7-8-day history of myalgias, cough, worsening shortness of breath.  Found to have acute hypoxic respiratory failure due to COVID-19 pneumonia.  See below for further details.  COVID-19 vaccinated status: Unvaccinated  Significant Events: 12/1>> Admit to [facility] for hypoxia due to COVID-19 pneumonia  Significant studies: 12/1>>Chest x-ray: Extensive bilateral pulmonary infiltrates.  COVID-19 medications: Steroids: 12/1>> Remdesivir: 12/1>> Actemra: 12/2 x 1  Antibiotics: None  Microbiology data: 12/1 >>blood culture: No growth  Procedures: None  Consults: None  DVT prophylaxis: Prophylactic Lovenox    Subjective:    Antonio Conner today remains short of breath with minimal activity-requiring anywhere from 5-6 L of HFNC.   Assessment  & Plan :   Acute Hypoxic Resp Failure due to Covid 19 Viral pneumonia: Gradually worsening hypoxemia-has extensive infiltrates on chest x-ray-up to 5-6 L of HFNC this morning.  Change Decadron to Solu-Medrol-rationale/risk/benefits of Actemra discussed and great detail with patient-he has consented to the use of Actemra.  He has no history of TB, hepatitis B or diverticulitis.  He understands that Actemra is under EUA by FDA.  Fever: afebrile O2 requirements:  SpO2: 93 % O2  Flow Rate (L/min): (S) 5 L/min   COVID-19 Labs: Recent Labs    06/28/20 2003 06/29/20 0103  DDIMER 0.74* 0.78*  FERRITIN 277 232  CRP 8.7* 7.9*    No results found for: BNP  Recent Labs  Lab 06/28/20 0440  PROCALCITON <0.10    Lab Results  Component Value Date   SARSCOV2NAA POSITIVE (A) 06/28/2020   SARSCOV2NAA NEGATIVE 05/04/2020   SARSCOV2NAA NOT DETECTED 05/28/2019     Prone/Incentive Spirometry: encouraged patient to lie prone for 3-4 hours at  a time for a total of 16 hours a day, and to encourage incentive spirometry use 3-4/hour.  Steroid-induced hyperglycemia: A1c on 12/1-5.9.  Monitor CBGs on SSI.  Recent Labs    06/28/20 1703 06/28/20 2103 06/29/20 0745  GLUCAP 123* 129* 131*    OSA: Noncompliant to CPAP-currently on HFNC.  Morbid obesity Estimated body mass index is 65.44 kg/m as calculated from the following:   Height as of this encounter: 5\' 9"  (1.753 m).   Weight as of this encounter: 201 kg.    GI prophylaxis: PPI  ABG: No results found for: PHART, PCO2ART, PO2ART, HCO3, TCO2, ACIDBASEDEF, O2SAT  Vent Settings: N/A   Condition - Extremely Guarded  Family Communication  : Significant other Marchelle Folks([name] [phone]) updated over the phone  Code Status :  Full Code  Diet :  Diet Order            Diet regular Room service appropriate? Yes; Fluid consistency: Thin  Diet effective now                  Disposition Plan  :   Status is: Inpatient  Remains inpatient appropriate because:Inpatient level of care appropriate due to severity of illness   Dispo: The patient is from: Home              Anticipated d/c is to: Home              Anticipated d/c date is: > 3 days              Patient currently is not medically stable to d/c.   Barriers to discharge: Hypoxia requiring O2 supplementation/complete 5 days of IV Remdesivir  Antimicorbials  :    Anti-infectives (From admission, onward)   Start     Dose/Rate Route Frequency Ordered  Stop   06/29/20 1000  remdesivir 100 mg in sodium chloride 0.9 % 100 mL IVPB       "Followed by" Linked Group Details   100 mg 200 mL/hr over 30 Minutes Intravenous Daily 06/28/20 0524     06/28/20 0600  remdesivir 100 mg in sodium chloride 0.9 % 100 mL IVPB       "Followed by" Linked Group Details   100 mg 200 mL/hr over 30 Minutes Intravenous Every 30 min 06/28/20 0524 06/28/20 0648      Inpatient Medications  Scheduled Meds: . ascorbic acid  500 mg Oral Daily  . cholecalciferol  2,000 Units Oral Daily  . enoxaparin (LOVENOX) injection  0.5 mg/kg Subcutaneous Q24H  . famotidine  20 mg Oral Daily  . insulin aspart  0-15 Units Subcutaneous TID WC  . Ipratropium-Albuterol  1 puff Inhalation Q6H  . methylPREDNISolone (SOLU-MEDROL) injection  100 mg Intravenous BID  . zinc sulfate  220 mg Oral Daily   Continuous Infusions: . remdesivir 100 mg in NS 100 mL Stopped (06/29/20 0900)  . tocilizumab (ACTEMRA) - non-COVID treatment     PRN Meds:.acetaminophen, albuterol, chlorpheniramine-HYDROcodone, guaiFENesin-dextromethorphan, ondansetron **OR** ondansetron (ZOFRAN) IV   Time Spent in minutes  35    See all Orders from today for further details   Jeoffrey MassedShanker Aslynn Brunetti M.D on 06/29/2020 at 11:59 AM  To page go to [email or web address] - use universal password  [facility] -  Office  [phone]    Objective:   Vitals:   06/28/20 2000 06/28/20 2355 06/29/20 0400 06/29/20 0730  BP: (!) 155/90 129/80 116/82 123/88  Pulse: 84 86 85 71  Resp: 20 14 10  20  Temp: 99.1 F (37.3 C) 98.2 F (36.8 C) 97.9 F (36.6 C) 98.3 F (36.8 C)  TempSrc: Oral Oral Oral Oral  SpO2: 93% 90% 92% 93%  Weight:      Height:        Wt Readings from Last 3 Encounters:  06/28/20 (!) 201 kg  05/10/20 (!) 198.7 kg  05/08/20 (!) 200.8 kg     Intake/Output Summary (Last 24 hours) at 06/29/2020 1159 Last data filed at 06/29/2020 0900 Gross per 24 hour  Intake 100 ml  Output --  Net 100 ml      Physical Exam Gen Exam:Alert awake-not in any distress HEENT:atraumatic, normocephalic Chest: B/L clear to auscultation anteriorly CVS:S1S2 regular Abdomen:soft non tender, non distended Extremities:no edema Neurology: Non focal Skin: no rash   Data Review:    CBC Recent Labs  Lab 06/28/20 0418 06/29/20 0103  WBC 6.2 5.3  HGB 12.1* 11.8*  HCT 36.7* 36.8*  PLT 170 181  MCV 84.2 85.0  MCH 27.8 27.3  MCHC 33.0 32.1  RDW 15.3 15.3  LYMPHSABS 1.2 0.6*  MONOABS 0.4 0.3  EOSABS 0.0 0.0  BASOSABS 0.0 0.0    Chemistries  Recent Labs  Lab 06/28/20 0418 06/29/20 0103  NA 133* 139  K 3.8 4.2  CL 100 103  CO2 22 25  GLUCOSE 137* 136*  BUN 13 12  CREATININE 1.24 1.07  CALCIUM 8.1* 8.6*  MG  --  2.2  AST  --  50*  ALT  --  33  ALKPHOS  --  56  BILITOT  --  0.4   ------------------------------------------------------------------------------------------------------------------ No results for input(s): CHOL, HDL, LDLCALC, TRIG, CHOLHDL, LDLDIRECT in the last 72 hours.  Lab Results  Component Value Date   HGBA1C 5.9 (H) 06/28/2020   ------------------------------------------------------------------------------------------------------------------ No results for input(s): TSH, T4TOTAL, T3FREE, THYROIDAB in the last 72 hours.  Invalid input(s): FREET3 ------------------------------------------------------------------------------------------------------------------ Recent Labs    06/28/20 2003 06/29/20 0103  FERRITIN 277 232    Coagulation profile No results for input(s): INR, PROTIME in the last 168 hours.  Recent Labs    06/28/20 2003 06/29/20 0103  DDIMER 0.74* 0.78*    Cardiac Enzymes No results for input(s): CKMB, TROPONINI, MYOGLOBIN in the last 168 hours.  Invalid input(s): CK ------------------------------------------------------------------------------------------------------------------ No results found for: BNP  Micro Results Recent  Results (from the past 240 hour(s))  Resp Panel by RT-PCR (Flu A&B, Covid) Nasopharyngeal Swab     Status: Abnormal   Collection Time: 06/28/20  3:52 AM   Specimen: Nasopharyngeal Swab; Nasopharyngeal(NP) swabs in vial transport medium  Result Value Ref Range Status   SARS Coronavirus 2 by RT PCR POSITIVE (A) NEGATIVE Final    Comment: RESULT CALLED TO, READ BACK BY AND VERIFIED WITH: KELLIE NEAL RN @0442  06/28/2020 OLSONM (NOTE) SARS-CoV-2 target nucleic acids are DETECTED.  The SARS-CoV-2 RNA is generally detectable in upper respiratory specimens during the acute phase of infection. Positive results are indicative of the presence of the identified virus, but do not rule out bacterial infection or co-infection with other pathogens not detected by the test. Clinical correlation with patient history and other diagnostic information is necessary to determine patient infection status. The expected result is Negative.  Fact Sheet for Patients: 14/07/2019  Fact Sheet for Healthcare Providers: BloggerCourse.com  This test is not yet approved or cleared by the [place] FDA and  has been authorized for detection and/or diagnosis of SARS-CoV-2 by FDA under an Emergency Use Authorization (EUA).  This EUA  will remain in effect (meaning this test c an be used) for the duration of  the COVID-19 declaration under Section 564(b)(1) of the Act, 21 U.S.C. section 360bbb-3(b)(1), unless the authorization is terminated or revoked sooner.     Influenza A by PCR NEGATIVE NEGATIVE Final   Influenza B by PCR NEGATIVE NEGATIVE Final    Comment: (NOTE) The Xpert Xpress SARS-CoV-2/FLU/RSV plus assay is intended as an aid in the diagnosis of influenza from Nasopharyngeal swab specimens and should not be used as a sole basis for treatment. Nasal washings and aspirates are unacceptable for Xpert Xpress SARS-CoV-2/FLU/RSV testing.  Fact Sheet  for Patients: BloggerCourse.com  Fact Sheet for Healthcare Providers: SeriousBroker.it  This test is not yet approved or cleared by the [place] FDA and has been authorized for detection and/or diagnosis of SARS-CoV-2 by FDA under an Emergency Use Authorization (EUA). This EUA will remain in effect (meaning this test can be used) for the duration of the COVID-19 declaration under Section 564(b)(1) of the Act, 21 U.S.C. section 360bbb-3(b)(1), unless the authorization is terminated or revoked.  Performed at [facility], [address] [place], [place] [address]   Blood culture (routine x 2)     Status: None (Preliminary result)   Collection Time: 06/28/20  4:18 AM   Specimen: BLOOD  Result Value Ref Range Status   Specimen Description   Final    BLOOD RIGHT ANTECUBITAL Performed at [facility], [address], [place], [place] [address]    Special Requests   Final    BOTTLES DRAWN AEROBIC AND ANAEROBIC Blood Culture adequate volume Performed at [facility], [address] [place], [place] [address]    Culture   Final    NO GROWTH 1 DAY Performed at [facility], [address] [place], [place] [address]    Report Status PENDING  Incomplete  Blood culture (routine x 2)     Status: None (Preliminary result)   Collection Time: 06/28/20  4:41 AM   Specimen: BLOOD  Result Value Ref Range Status   Specimen Description   Final    BLOOD LEFT ANTECUBITAL Performed at [facility], [address], [place], [place] [address]    Special Requests   Final    BOTTLES DRAWN AEROBIC AND ANAEROBIC Blood Culture adequate volume Performed at [facility] [address] [place], [place] [address]    Culture   Final    NO GROWTH 1 DAY Performed at [facility], [address], [place], [place] [address]    Report Status PENDING  Incomplete    Radiology Reports DG Chest  2 View  Result Date: 06/28/2020 CLINICAL DATA:  Dyspnea EXAM: CHEST - 2 VIEW COMPARISON:  05/10/2020 FINDINGS: Lung volumes are extremely small, but are symmetric. There is superimposed extensive patchy perihilar pulmonary infiltrate, likely infectious or inflammatory in the acute setting. No pneumothorax or pleural effusion. Cardiac size within normal limits. IMPRESSION: Pulmonary hypoinflation. Interval development of extensive bilateral pulmonary infiltrates, likely infectious or inflammatory. Electronically Signed   By: Helyn Numbers MD   On: 06/28/2020 04:11

## 2020-06-30 DIAGNOSIS — R112 Nausea with vomiting, unspecified: Secondary | ICD-10-CM | POA: Diagnosis not present

## 2020-06-30 DIAGNOSIS — J1282 Pneumonia due to coronavirus disease 2019: Secondary | ICD-10-CM | POA: Diagnosis not present

## 2020-06-30 DIAGNOSIS — R197 Diarrhea, unspecified: Secondary | ICD-10-CM | POA: Diagnosis not present

## 2020-06-30 DIAGNOSIS — U071 COVID-19: Secondary | ICD-10-CM | POA: Diagnosis not present

## 2020-06-30 LAB — CBC WITH DIFFERENTIAL/PLATELET
Abs Immature Granulocytes: 0.03 10*3/uL (ref 0.00–0.07)
Basophils Absolute: 0 10*3/uL (ref 0.0–0.1)
Basophils Relative: 0 %
Eosinophils Absolute: 0 10*3/uL (ref 0.0–0.5)
Eosinophils Relative: 0 %
HCT: 39.6 % (ref 39.0–52.0)
Hemoglobin: 12.4 g/dL — ABNORMAL LOW (ref 13.0–17.0)
Immature Granulocytes: 1 %
Lymphocytes Relative: 16 %
Lymphs Abs: 0.6 10*3/uL — ABNORMAL LOW (ref 0.7–4.0)
MCH: 27 pg (ref 26.0–34.0)
MCHC: 31.3 g/dL (ref 30.0–36.0)
MCV: 86.1 fL (ref 80.0–100.0)
Monocytes Absolute: 0.2 10*3/uL (ref 0.1–1.0)
Monocytes Relative: 5 %
Neutro Abs: 3.1 10*3/uL (ref 1.7–7.7)
Neutrophils Relative %: 78 %
Platelets: 235 10*3/uL (ref 150–400)
RBC: 4.6 MIL/uL (ref 4.22–5.81)
RDW: 15.4 % (ref 11.5–15.5)
WBC: 3.9 10*3/uL — ABNORMAL LOW (ref 4.0–10.5)
nRBC: 0 % (ref 0.0–0.2)

## 2020-06-30 LAB — COMPREHENSIVE METABOLIC PANEL
ALT: 33 U/L (ref 0–44)
AST: 41 U/L (ref 15–41)
Albumin: 3.2 g/dL — ABNORMAL LOW (ref 3.5–5.0)
Alkaline Phosphatase: 55 U/L (ref 38–126)
Anion gap: 11 (ref 5–15)
BUN: 15 mg/dL (ref 6–20)
CO2: 26 mmol/L (ref 22–32)
Calcium: 8.6 mg/dL — ABNORMAL LOW (ref 8.9–10.3)
Chloride: 102 mmol/L (ref 98–111)
Creatinine, Ser: 1.07 mg/dL (ref 0.61–1.24)
GFR, Estimated: >60 mL/min (ref >60)
Glucose, Bld: 153 mg/dL — ABNORMAL HIGH (ref 70–99)
Potassium: 4.6 mmol/L (ref 3.5–5.1)
Sodium: 139 mmol/L (ref 135–145)
Total Bilirubin: 0.3 mg/dL (ref 0.3–1.2)
Total Protein: 7 g/dL (ref 6.5–8.1)

## 2020-06-30 LAB — GLUCOSE, CAPILLARY
Glucose-Capillary: 147 mg/dL — ABNORMAL HIGH (ref 70–99)
Glucose-Capillary: 133 mg/dL — ABNORMAL HIGH (ref 70–99)
Glucose-Capillary: 147 mg/dL — ABNORMAL HIGH (ref 70–99)
Glucose-Capillary: 149 mg/dL — ABNORMAL HIGH (ref 70–99)

## 2020-06-30 LAB — C-REACTIVE PROTEIN: CRP: 3.5 mg/dL — ABNORMAL HIGH (ref <1.0)

## 2020-06-30 LAB — D-DIMER, QUANTITATIVE: D-Dimer, Quant: 0.5 ug/mL-FEU (ref 0.00–0.50)

## 2020-06-30 NOTE — Progress Notes (Signed)
PROGRESS NOTE                                                                                                                                                                                                             Patient Demographics:    Antonio Conner, is a 34 y.o. male, DOB - 11/19/1985, BJY:[id number]  Outpatient Primary MD for the patient is Jackie Plumsei-Bonsu, George, MD   Admit date - 06/28/2020   LOS - 2  Chief Complaint  Patient presents with  . Shortness of Breath       Brief Narrative: Patient is a 34 y.o. male with PMHx of OSA-noncompliant to CPAP, morbid obesity-presenting with approximately 7-8-day history of myalgias, cough, worsening shortness of breath.  Found to have acute hypoxic respiratory failure due to COVID-19 pneumonia.  See below for further details.  COVID-19 vaccinated status: Unvaccinated  Significant Events: 12/1>> Admit to [facility] for hypoxia due to COVID-19 pneumonia  Significant studies: 12/1>>Chest x-ray: Extensive bilateral pulmonary infiltrates.  COVID-19 medications: Steroids: 12/1>> Remdesivir: 12/1>> Actemra: 12/2 x 1  Antibiotics: None  Microbiology data: 12/1 >>blood culture: No growth  Procedures: None  Consults: None  DVT prophylaxis: Prophylactic Lovenox    Subjective:   Claims he feels much better-he was titrated to room air this morning.   Assessment  & Plan :   Acute Hypoxic Resp Failure due to Covid 19 Viral pneumonia: Had moderate to severe hypoxemia requiring around 6 L of HFNC on 12/2-given Actemra/steroid/Remdesivir with significant improvement-minimal to no oxygen requirement today.  Symptomatically feels much better.  Continue steroids/Remdesivir.  Ambulate-see how he does.  May need home O2 on discharge.  Suspect has some amount of OSA/OHS-likely playing a role in some of his exertional dyspnea symptoms.   Fever: afebrile O2 requirements:  SpO2: 92  % O2 Flow Rate (L/min): 3 L/min   COVID-19 Labs: Recent Labs    06/28/20 2003 06/29/20 0103 06/30/20 0314  DDIMER 0.74* 0.78* 0.50  FERRITIN 277 232  --   CRP 8.7* 7.9* 3.5*    No results found for: BNP  Recent Labs  Lab 06/28/20 0440  PROCALCITON <0.10    Lab Results  Component Value Date   SARSCOV2NAA POSITIVE (A) 06/28/2020   SARSCOV2NAA NEGATIVE 05/04/2020   SARSCOV2NAA NOT DETECTED 05/28/2019     Prone/Incentive Spirometry: encouraged patient to lie  prone for 3-4 hours at a time for a total of 16 hours a day, and to encourage incentive spirometry use 3-4/hour.  Steroid-induced hyperglycemia: A1c on 12/1-5.9.  Monitor CBGs on SSI.  Recent Labs    06/29/20 2106 06/30/20 0718 06/30/20 1143  GLUCAP 143* 147* 133*    OSA: Noncompliant to CPAP-stopped wearing CPAP for the past few years-currently on HFNC.  Encourage patient to follow-up with his PCP for resumption of CPAP.  Morbid obesity Estimated body mass index is 65.44 kg/m as calculated from the following:   Height as of this encounter: 5\' 9"  (1.753 m).   Weight as of this encounter: 201 kg.    GI prophylaxis: PPI  ABG: No results found for: PHART, PCO2ART, PO2ART, HCO3, TCO2, ACIDBASEDEF, O2SAT  Vent Settings: N/A   Condition - Extremely Guarded  Family Communication  : Significant other [phone]) updated over the phone 12/3  Code Status :  Full Code  Diet :  Diet Order            Diet regular Room service appropriate? Yes; Fluid consistency: Thin  Diet effective now                  Disposition Plan  :   Status is: Inpatient  Remains inpatient appropriate because:Inpatient level of care appropriate due to severity of illness   Dispo: The patient is from: Home              Anticipated d/c is to: Home              Anticipated d/c date is: > 1-2 days              Patient currently is not medically stable to d/c.   Barriers to discharge: Hypoxia requiring O2  supplementation/complete 5 days of IV Remdesivir  Antimicorbials  :    Anti-infectives (From admission, onward)   Start     Dose/Rate Route Frequency Ordered Stop   06/29/20 1000  remdesivir 100 mg in sodium chloride 0.9 % 100 mL IVPB       "Followed by" Linked Group Details   100 mg 200 mL/hr over 30 Minutes Intravenous Daily 06/28/20 0524     06/28/20 0600  remdesivir 100 mg in sodium chloride 0.9 % 100 mL IVPB       "Followed by" Linked Group Details   100 mg 200 mL/hr over 30 Minutes Intravenous Every 30 min 06/28/20 0524 06/28/20 0648      Inpatient Medications  Scheduled Meds: . ascorbic acid  500 mg Oral Daily  . cholecalciferol  2,000 Units Oral Daily  . enoxaparin (LOVENOX) injection  0.5 mg/kg Subcutaneous Q24H  . famotidine  20 mg Oral Daily  . insulin aspart  0-15 Units Subcutaneous TID WC  . Ipratropium-Albuterol  1 puff Inhalation Q6H  . melatonin  5 mg Oral QHS  . methylPREDNISolone (SOLU-MEDROL) injection  100 mg Intravenous BID  . pantoprazole  40 mg Oral Q1200  . zinc sulfate  220 mg Oral Daily   Continuous Infusions: . remdesivir 100 mg in NS 100 mL Stopped (06/30/20 1140)   PRN Meds:.acetaminophen, albuterol, chlorpheniramine-HYDROcodone, guaiFENesin-dextromethorphan, ondansetron **OR** ondansetron (ZOFRAN) IV   Time Spent in minutes  25    See all Orders from today for further details   14/03/21 M.D on 06/30/2020 at 1:43 PM  To page go to [email or web address] - use universal password  [facility] -  Office  [phone]    Objective:  Vitals:   06/29/20 1420 06/29/20 2017 06/30/20 0429 06/30/20 1318  BP: 138/82 116/70 135/76 124/65  Pulse: 80 83 78 79  Resp: 18 20 18 20   Temp: 98.2 F (36.8 C) 98.3 F (36.8 C) 97.8 F (36.6 C) 98.1 F (36.7 C)  TempSrc: Oral Oral Oral Axillary  SpO2: 93% 91% 90% 92%  Weight:      Height:        Wt Readings from Last 3 Encounters:  06/28/20 (!) 201 kg  05/10/20 (!) 198.7 kg  05/08/20 (!)  200.8 kg     Intake/Output Summary (Last 24 hours) at 06/30/2020 1343 Last data filed at 06/30/2020 1300 Gross per 24 hour  Intake 360 ml  Output --  Net 360 ml     Physical Exam Gen Exam:Alert awake-not in any distress HEENT:atraumatic, normocephalic Chest: B/L clear to auscultation anteriorly CVS:S1S2 regular Abdomen:soft non tender, non distended Extremities:no edema Neurology: Non focal Skin: no rash   Data Review:    CBC Recent Labs  Lab 06/28/20 0418 06/29/20 0103 06/30/20 0314  WBC 6.2 5.3 3.9*  HGB 12.1* 11.8* 12.4*  HCT 36.7* 36.8* 39.6  PLT 170 181 235  MCV 84.2 85.0 86.1  MCH 27.8 27.3 27.0  MCHC 33.0 32.1 31.3  RDW 15.3 15.3 15.4  LYMPHSABS 1.2 0.6* 0.6*  MONOABS 0.4 0.3 0.2  EOSABS 0.0 0.0 0.0  BASOSABS 0.0 0.0 0.0    Chemistries  Recent Labs  Lab 06/28/20 0418 06/29/20 0103 06/30/20 0314  NA 133* 139 139  K 3.8 4.2 4.6  CL 100 103 102  CO2 22 25 26   GLUCOSE 137* 136* 153*  BUN 13 12 15   CREATININE 1.24 1.07 1.07  CALCIUM 8.1* 8.6* 8.6*  MG  --  2.2  --   AST  --  50* 41  ALT  --  33 33  ALKPHOS  --  56 55  BILITOT  --  0.4 0.3   ------------------------------------------------------------------------------------------------------------------ No results for input(s): CHOL, HDL, LDLCALC, TRIG, CHOLHDL, LDLDIRECT in the last 72 hours.  Lab Results  Component Value Date   HGBA1C 5.9 (H) 06/28/2020   ------------------------------------------------------------------------------------------------------------------ No results for input(s): TSH, T4TOTAL, T3FREE, THYROIDAB in the last 72 hours.  Invalid input(s): FREET3 ------------------------------------------------------------------------------------------------------------------ Recent Labs    06/28/20 2003 06/29/20 0103  FERRITIN 277 232    Coagulation profile No results for input(s): INR, PROTIME in the last 168 hours.  Recent Labs    06/29/20 0103 06/30/20 0314   DDIMER 0.78* 0.50    Cardiac Enzymes No results for input(s): CKMB, TROPONINI, MYOGLOBIN in the last 168 hours.  Invalid input(s): CK ------------------------------------------------------------------------------------------------------------------ No results found for: BNP  Micro Results Recent Results (from the past 240 hour(s))  Resp Panel by RT-PCR (Flu A&B, Covid) Nasopharyngeal Swab     Status: Abnormal   Collection Time: 06/28/20  3:52 AM   Specimen: Nasopharyngeal Swab; Nasopharyngeal(NP) swabs in vial transport medium  Result Value Ref Range Status   SARS Coronavirus 2 by RT PCR POSITIVE (A) NEGATIVE Final    Comment: RESULT CALLED TO, READ BACK BY AND VERIFIED WITH: KELLIE NEAL RN @0442  06/28/2020 OLSONM (NOTE) SARS-CoV-2 target nucleic acids are DETECTED.  The SARS-CoV-2 RNA is generally detectable in upper respiratory specimens during the acute phase of infection. Positive results are indicative of the presence of the identified virus, but do not rule out bacterial infection or co-infection with other pathogens not detected by the test. Clinical correlation with patient history and other diagnostic information  is necessary to determine patient infection status. The expected result is Negative.  Fact Sheet for Patients: https://www.fda.gov/media/152166/download  Fact Sheet for Healthcare Providers: SeriousBroker.it  TBloggerCourse.comd by the [place] FDA and  has been authorized for detection and/or diagnosis of SARS-CoV-2 by FDA under an Emergency Use Authorization (EUA).  This EUA will remain in effect (meaning this test c an be used) for the duration of  the COVID-19 declaration under Section 564(b)(1) of the Act, 21 U.S.C. section 360bbb-3(b)(1), unless the authorization is terminated or revoked sooner.     Influenza A by PCR NEGATIVE NEGATIVE Final   Influenza B by PCR NEGATIVE NEGATIVE Final     Comment: (NOTE) The Xpert Xpress SARS-CoV-2/FLU/RSV plus assay is intended as an aid in the diagnosis of influenza from Nasopharyngeal swab specimens and should not be used as a sole basis for treatment. Nasal washings and aspirates are unacceptable for Xpert Xpress SARS-CoV-2/FLU/RSV testing.  Fact Sheet for Patients: BloggerCourse.com  Fact Sheet for Healthcare Providers: SeriousBroker.it  This test is not yet approved or cleared by the [place] FDA and has been authorized for detection and/or diagnosis of SARS-CoV-2 by FDA under an Emergency Use Authorization (EUA). This EUA will remain in effect (meaning this test can be used) for the duration of the COVID-19 declaration under Section 564(b)(1) of the Act, 21 U.S.C. section 360bbb-3(b)(1), unless the authorization is terminated or revoked.  Performed at [facility], [address] [place], [place] [address]   Blood culture (routine x 2)     Status: None (Preliminary result)   Collection Time: 06/28/20  4:18 AM   Specimen: BLOOD  Result Value Ref Range Status   Specimen Description   Final    BLOOD RIGHT ANTECUBITAL Performed at [facility], [address] [place], [place] [address]    Special Requests   Final    BOTTLES DRAWN AEROBIC AND ANAEROBIC Blood Culture adequate volume Performed at [facility], [address] [place], [place] [address]    Culture   Final    NO GROWTH 2 DAYS Performed at [facility], [address] [place], [place] [address]    Report Status PENDING  Incomplete  Blood culture (routine x 2)     Status: None (Preliminary result)   Collection Time: 06/28/20  4:41 AM   Specimen: BLOOD  Result Value Ref Range Status   Specimen Description   Final    BLOOD LEFT ANTECUBITAL Performed at [facility], [address], [place], [place] [address]    Special Requests   Final    BOTTLES DRAWN AEROBIC  AND ANAEROBIC Blood Culture adequate volume Performed at [facility], [address] [place], [place] [address]    Culture   Final    NO GROWTH 2 DAYS Performed at [facility], [address] [place], [place] [address]    Report Status PENDING  Incomplete    Radiology Reports DG Chest 2 View  Result Date: 06/28/2020 CLINICAL DATA:  Dyspnea EXAM: CHEST - 2 VIEW COMPARISON:  05/10/2020 FINDINGS: Lung volumes are extremely small, but are symmetric. There is superimposed extensive patchy perihilar pulmonary infiltrate, likely infectious or inflammatory in the acute setting. No pneumothorax or pleural effusion. Cardiac size within normal limits. IMPRESSION: Pulmonary hypoinflation. Interval development of extensive bilateral pulmonary infiltrates, likely infectious or inflammatory. Electronically Signed   By: Helyn Numbers  MD   On: 06/28/2020 04:11

## 2020-06-30 NOTE — Evaluation (Signed)
Physical Therapy Evaluation Patient Details Name: Antonio Conner MRN: [id number] DOB: Nov 03, 1985 Today's Date: 06/30/2020   History of Present Illness  34 y.o. male with PMHx of OSA-noncompliant to CPAP, morbid obesity-presenting with approximately 7-8-day history of myalgias, cough, worsening shortness of breath.  Found to have acute hypoxic respiratory failure due to COVID-19 pneumonia.    Clinical Impression  Pt admitted with above diagnosis. PTA pt active and independent. On eval, he demonstrated mod I bed mobility and transfers. Supervision ambulation 150' without AD. SpO2 91% at rest on 1L. SpO2 89% at rest on RA. Desat to 85% during amb on RA, but pt asymptomatic.  Pt currently with functional limitations due to the deficits listed below. Pt will benefit from skilled PT to increase their independence and safety with mobility to allow discharge home. PT to follow acutely. No follow up services indicated.       Follow Up Recommendations No PT follow up    Equipment Recommendations  None recommended by PT    Recommendations for Other Services       Precautions / Restrictions Precautions Precautions: Other (comment) Precaution Comments: watch sats      Mobility  Bed Mobility Overal bed mobility: Modified Independent                  Transfers Overall transfer level: Modified independent Equipment used: None                Ambulation/Gait Ambulation/Gait assistance: Supervision Gait Distance (Feet): 150 Feet Assistive device: None Gait Pattern/deviations: Step-through pattern;Wide base of support Gait velocity: WFL Gait velocity interpretation: >2.62 ft/sec, indicative of community ambulatory General Gait Details: Amb on RA with desat to 85%. Pt asymptomatic. No SOB noted. Able to hold conversation.  Stairs            Wheelchair Mobility    Modified Rankin (Stroke Patients Only)       Balance Overall balance assessment: No apparent balance  deficits (not formally assessed)                                           Pertinent Vitals/Pain Pain Assessment: No/denies pain    Home Living Family/patient expects to be discharged to:: Private residence Living Arrangements: Spouse/significant other;Children Available Help at Discharge: Family;Available 24 hours/day Type of Home: House Home Access: Stairs to enter Entrance Stairs-Rails: Right;Left;Can reach both Entrance Stairs-Number of Steps: 4 Home Layout: Able to live on main level with bedroom/bathroom;Two level Home Equipment: None      Prior Function Level of Independence: Independent         Comments: Pt on medical leave from work at the [employer] due to recent knee sx (04/2020).     Hand Dominance        Extremity/Trunk Assessment   Upper Extremity Assessment Upper Extremity Assessment: Overall WFL for tasks assessed    Lower Extremity Assessment Lower Extremity Assessment: Overall WFL for tasks assessed    Cervical / Trunk Assessment Cervical / Trunk Assessment: Normal  Communication   Communication: No difficulties  Cognition Arousal/Alertness: Awake/alert Behavior During Therapy: WFL for tasks assessed/performed Overall Cognitive Status: Within Functional Limits for tasks assessed  General Comments General comments (skin integrity, edema, etc.): SpO2 91% at rest on 1L. Amb on RA with desat to 85%. SpO2 89% at rest on RA. 1L O2 replaced at end of session.    Exercises     Assessment/Plan    PT Assessment Patient needs continued PT services  PT Problem List Decreased mobility;Decreased activity tolerance;Cardiopulmonary status limiting activity       PT Treatment Interventions Therapeutic activities;Gait training;Therapeutic exercise;Patient/family education;Functional mobility training    PT Goals (Current goals can be found in the Care Plan section)  Acute  Rehab PT Goals Patient Stated Goal: home PT Goal Formulation: With patient Time For Goal Achievement: 07/14/20 Potential to Achieve Goals: Good    Frequency Min 3X/week   Barriers to discharge        Co-evaluation               AM-PAC PT "6 Clicks" Mobility  Outcome Measure Help needed turning from your back to your side while in a flat bed without using bedrails?: None Help needed moving from lying on your back to sitting on the side of a flat bed without using bedrails?: None Help needed moving to and from a bed to a chair (including a wheelchair)?: None Help needed standing up from a chair using your arms (e.g., wheelchair or bedside chair)?: None Help needed to walk in hospital room?: A Little Help needed climbing 3-5 steps with a railing? : A Little 6 Click Score: 22    End of Session Equipment Utilized During Treatment: Oxygen Activity Tolerance: Patient tolerated treatment well Patient left: in bed;with call bell/phone within reach Nurse Communication: Mobility status PT Visit Diagnosis: Difficulty in walking, not elsewhere classified (R26.2)    Time: 5465-0354 PT Time Calculation (min) (ACUTE ONLY): 12 min   Charges:   PT Evaluation $PT Eval Low Complexity: 1 Low          Aida Raider, PT  Office # [phone] Pager [phone]   Ilda Foil 06/30/2020, 12:44 PM

## 2020-07-01 DIAGNOSIS — J1282 Pneumonia due to coronavirus disease 2019: Secondary | ICD-10-CM | POA: Diagnosis not present

## 2020-07-01 DIAGNOSIS — U071 COVID-19: Secondary | ICD-10-CM | POA: Diagnosis not present

## 2020-07-01 LAB — COMPREHENSIVE METABOLIC PANEL
ALT: 100 U/L — ABNORMAL HIGH (ref 0–44)
AST: 105 U/L — ABNORMAL HIGH (ref 15–41)
Albumin: 3.2 g/dL — ABNORMAL LOW (ref 3.5–5.0)
Alkaline Phosphatase: 55 U/L (ref 38–126)
Anion gap: 11 (ref 5–15)
BUN: 17 mg/dL (ref 6–20)
CO2: 26 mmol/L (ref 22–32)
Calcium: 8.7 mg/dL — ABNORMAL LOW (ref 8.9–10.3)
Chloride: 103 mmol/L (ref 98–111)
Creatinine, Ser: 1.17 mg/dL (ref 0.61–1.24)
GFR, Estimated: >60 mL/min (ref >60)
Glucose, Bld: 147 mg/dL — ABNORMAL HIGH (ref 70–99)
Potassium: 4.6 mmol/L (ref 3.5–5.1)
Sodium: 140 mmol/L (ref 135–145)
Total Bilirubin: 0.6 mg/dL (ref 0.3–1.2)
Total Protein: 7.1 g/dL (ref 6.5–8.1)

## 2020-07-01 LAB — CBC WITH DIFFERENTIAL/PLATELET
Abs Immature Granulocytes: 0.07 10*3/uL (ref 0.00–0.07)
Basophils Absolute: 0 10*3/uL (ref 0.0–0.1)
Basophils Relative: 0 %
Eosinophils Absolute: 0 10*3/uL (ref 0.0–0.5)
Eosinophils Relative: 0 %
HCT: 38.7 % — ABNORMAL LOW (ref 39.0–52.0)
Hemoglobin: 12.2 g/dL — ABNORMAL LOW (ref 13.0–17.0)
Immature Granulocytes: 1 %
Lymphocytes Relative: 13 %
Lymphs Abs: 0.8 10*3/uL (ref 0.7–4.0)
MCH: 26.9 pg (ref 26.0–34.0)
MCHC: 31.5 g/dL (ref 30.0–36.0)
MCV: 85.2 fL (ref 80.0–100.0)
Monocytes Absolute: 0.5 10*3/uL (ref 0.1–1.0)
Monocytes Relative: 7 %
Neutro Abs: 4.8 10*3/uL (ref 1.7–7.7)
Neutrophils Relative %: 79 %
Platelets: 294 10*3/uL (ref 150–400)
RBC: 4.54 MIL/uL (ref 4.22–5.81)
RDW: 15.3 % (ref 11.5–15.5)
WBC: 6.2 10*3/uL (ref 4.0–10.5)
nRBC: 0 % (ref 0.0–0.2)

## 2020-07-01 LAB — GLUCOSE, CAPILLARY
Glucose-Capillary: 117 mg/dL — ABNORMAL HIGH (ref 70–99)
Glucose-Capillary: 135 mg/dL — ABNORMAL HIGH (ref 70–99)
Glucose-Capillary: 113 mg/dL — ABNORMAL HIGH (ref 70–99)
Glucose-Capillary: 140 mg/dL — ABNORMAL HIGH (ref 70–99)

## 2020-07-01 LAB — C-REACTIVE PROTEIN: CRP: 1.6 mg/dL — ABNORMAL HIGH (ref <1.0)

## 2020-07-01 LAB — D-DIMER, QUANTITATIVE: D-Dimer, Quant: 0.52 ug/mL-FEU — ABNORMAL HIGH (ref 0.00–0.50)

## 2020-07-01 MED ORDER — METHYLPREDNISOLONE SODIUM SUCC 125 MG IJ SOLR
60.0000 mg | Freq: Every day | INTRAMUSCULAR | Status: DC
  Administered 2020-07-01 – 2020-07-02 (×2): 60 mg via INTRAVENOUS
  Filled 2020-07-01 (×2): qty 2

## 2020-07-01 MED ORDER — METHYLPREDNISOLONE SODIUM SUCC 125 MG IJ SOLR
60.0000 mg | Freq: Two times a day (BID) | INTRAMUSCULAR | Status: DC
Start: 2020-07-01 — End: 2020-07-01

## 2020-07-01 MED ORDER — ENOXAPARIN SODIUM 100 MG/ML ~~LOC~~ SOLN
100.0000 mg | SUBCUTANEOUS | Status: DC
  Administered 2020-07-01: 100 mg via SUBCUTANEOUS
  Filled 2020-07-01: qty 1

## 2020-07-01 NOTE — Progress Notes (Signed)
PROGRESS NOTE                                                                                                                                                                                                             Patient Demographics:    Antonio Conner, is a 34 y.o. male, DOB - 04-Mar-1986, ERX:[id number]  Outpatient Primary MD for the patient is Jackie Plum, MD   Admit date - 06/28/2020   LOS - 3  Chief Complaint  Patient presents with  . Shortness of Breath       Brief Narrative: Patient is a 34 y.o. male with PMHx of OSA-noncompliant to CPAP, morbid obesity-presenting with approximately 7-8-day history of myalgias, cough, worsening shortness of breath.  Found to have acute hypoxic respiratory failure due to COVID-19 pneumonia.  See below for further details.  COVID-19 vaccinated status: Unvaccinated  Significant Events: 12/1>> Admit to [facility] for hypoxia due to COVID-19 pneumonia  Significant studies: 12/1>>Chest x-ray: Extensive bilateral pulmonary infiltrates.  COVID-19 medications: Steroids: 12/1>> Remdesivir: 12/1>> Actemra: 12/2 x 1  Antibiotics: None  Microbiology data: 12/1 >>blood culture: No growth  Procedures: None  Consults: None  DVT prophylaxis: Prophylactic Lovenox    Subjective:   Patient in bed, appears comfortable, denies any headache, no fever, no chest pain or pressure, no shortness of breath , no abdominal pain. No focal weakness.    Assessment  & Plan :   Acute Hypoxic Resp Failure due to Covid 19 Viral pneumonia: Had moderate to severe hypoxemia requiring around 6 L of HFNC on 12/2-given Actemra/steroid/Remdesivir with significant improvement-minimal to no oxygen requirement today.  Symptomatically feels much better.  Continue steroids/Remdesivir.  Ambulate-see how he does.  May need home O2 on discharge.  Suspect has some amount of OSA/OHS-likely playing a role in  some of his exertional dyspnea symptoms.   Fever: afebrile O2 requirements:  SpO2: 90 % O2 Flow Rate (L/min): 1 L/min   COVID-19 Labs: Recent Labs    06/28/20 2003 06/28/20 2003 06/29/20 0103 06/30/20 0314 07/01/20 0406  DDIMER 0.74*   < > 0.78* 0.50 0.52*  FERRITIN 277  --  232  --   --   CRP 8.7*   < > 7.9* 3.5* 1.6*   < > = values in this interval not displayed.    No results found for: BNP  Recent Labs  Lab 06/28/20 0440  PROCALCITON <0.10    Lab Results  Component Value Date   SARSCOV2NAA POSITIVE (A) 06/28/2020   SARSCOV2NAA NEGATIVE 05/04/2020   SARSCOV2NAA NOT DETECTED 05/28/2019     Prone/Incentive Spirometry: encouraged patient to lie prone for 3-4 hours at a time for a total of 16 hours a day, and to encourage incentive spirometry use 3-4/hour.  Steroid-induced hyperglycemia: A1c on 12/1-5.9.  Monitor CBGs on SSI.  Recent Labs    06/30/20 2110 07/01/20 0754 07/01/20 1150  GLUCAP 149* 117* 135*    OSA: Noncompliant to CPAP-stopped wearing CPAP for the past few years-currently on HFNC.  Encourage patient to follow-up with his PCP for resumption of CPAP.  Morbid obesity Estimated body mass index is 65.44 kg/m as calculated from the following:   Height as of this encounter: 5\' 9"  (1.753 m).   Weight as of this encounter: 201 kg.    GI prophylaxis: PPI  Condition - Extremely Guarded  Family Communication  : Significant other Marchelle Folks([name] [phone]) updated over the phone 06/30/20 by previous MD.  Code Status :  Full Code  Diet :  Diet Order            Diet regular Room service appropriate? Yes; Fluid consistency: Thin  Diet effective now                  Disposition Plan  :   Status is: Inpatient  Remains inpatient appropriate because:Inpatient level of care appropriate due to severity of illness   Dispo: The patient is from: Home              Anticipated d/c is to: Home              Anticipated d/c date is: > 1-2 days               Patient currently is not medically stable to d/c.   Barriers to discharge: Hypoxia requiring O2 supplementation/complete 5 days of IV Remdesivir  Antimicorbials  :    Anti-infectives (From admission, onward)   Start     Dose/Rate Route Frequency Ordered Stop   06/29/20 1000  remdesivir 100 mg in sodium chloride 0.9 % 100 mL IVPB       "Followed by" Linked Group Details   100 mg 200 mL/hr over 30 Minutes Intravenous Daily 06/28/20 0524     06/28/20 0600  remdesivir 100 mg in sodium chloride 0.9 % 100 mL IVPB       "Followed by" Linked Group Details   100 mg 200 mL/hr over 30 Minutes Intravenous Every 30 min 06/28/20 0524 06/28/20 0648      Inpatient Medications  Scheduled Meds: . ascorbic acid  500 mg Oral Daily  . cholecalciferol  2,000 Units Oral Daily  . enoxaparin (LOVENOX) injection  100 mg Subcutaneous Q24H  . famotidine  20 mg Oral Daily  . insulin aspart  0-15 Units Subcutaneous TID WC  . Ipratropium-Albuterol  1 puff Inhalation Q6H  . melatonin  5 mg Oral QHS  . methylPREDNISolone (SOLU-MEDROL) injection  60 mg Intravenous Daily  . pantoprazole  40 mg Oral Q1200  . zinc sulfate  220 mg Oral Daily   Continuous Infusions: . remdesivir 100 mg in NS 100 mL 100 mg (07/01/20 1003)   PRN Meds:.acetaminophen, albuterol, chlorpheniramine-HYDROcodone, guaiFENesin-dextromethorphan, [DISCONTINUED] ondansetron **OR** ondansetron (ZOFRAN) IV   Time Spent in minutes  25    See all Orders from today for further details  Susa Raring M.D on 07/01/2020 at 12:01 PM  To page go to [email or web address] - use universal password  [facility] -  Office  [phone]    Objective:   Vitals:   06/30/20 1318 06/30/20 2112 07/01/20 0438 07/01/20 0751  BP: 124/65 121/60 121/64 124/75  Pulse: 79 80 92 76  Resp: 20 19 18 19   Temp: 98.1 F (36.7 C) 97.6 F (36.4 C) 97.6 F (36.4 C) 98.2 F (36.8 C)  TempSrc: Axillary Oral Oral Oral  SpO2: 92% 90% 90% 90%  Weight:        Height:        Wt Readings from Last 3 Encounters:  06/28/20 (!) 201 kg  05/10/20 (!) 198.7 kg  05/08/20 (!) 200.8 kg     Intake/Output Summary (Last 24 hours) at 07/01/2020 1201 Last data filed at 06/30/2020 1300 Gross per 24 hour  Intake 120 ml  Output --  Net 120 ml     Physical Exam  Awake Alert, No new F.N deficits, Normal affect [place].AT,PERRAL Supple Neck,No JVD, No cervical lymphadenopathy appriciated.  Symmetrical Chest wall movement, Good air movement bilaterally, CTAB RRR,No Gallops, Rubs or new Murmurs, No Parasternal Heave +ve B.Sounds, Abd Soft, No tenderness, No organomegaly appriciated, No rebound - guarding or rigidity. No Cyanosis, Clubbing or edema, No new Rash or bruise    Data Review:    Recent Labs  Lab 06/28/20 0418 06/29/20 0103 06/30/20 0314 07/01/20 0406  WBC 6.2 5.3 3.9* 6.2  HGB 12.1* 11.8* 12.4* 12.2*  HCT 36.7* 36.8* 39.6 38.7*  PLT 170 181 235 294  MCV 84.2 85.0 86.1 85.2  MCH 27.8 27.3 27.0 26.9  MCHC 33.0 32.1 31.3 31.5  RDW 15.3 15.3 15.4 15.3  LYMPHSABS 1.2 0.6* 0.6* 0.8  MONOABS 0.4 0.3 0.2 0.5  EOSABS 0.0 0.0 0.0 0.0  BASOSABS 0.0 0.0 0.0 0.0    Recent Labs  Lab 06/28/20 0418 06/28/20 0440 06/28/20 2003 06/29/20 0103 06/30/20 0314 07/01/20 0406  NA 133*  --   --  139 139 140  K 3.8  --   --  4.2 4.6 4.6  CL 100  --   --  103 102 103  CO2 22  --   --  25 26 26   GLUCOSE 137*  --   --  136* 153* 147*  BUN 13  --   --  12 15 17   CREATININE 1.24  --   --  1.07 1.07 1.17  CALCIUM 8.1*  --   --  8.6* 8.6* 8.7*  AST  --   --   --  50* 41 105*  ALT  --   --   --  33 33 100*  ALKPHOS  --   --   --  56 55 55  BILITOT  --   --   --  0.4 0.3 0.6  ALBUMIN  --   --   --  3.3* 3.2* 3.2*  MG  --   --   --  2.2  --   --   CRP  --   --  8.7* 7.9* 3.5* 1.6*  DDIMER  --   --  0.74* 0.78* 0.50 0.52*  PROCALCITON  --  <0.10  --   --   --   --   LATICACIDVEN  --  1.3  --   --   --   --   HGBA1C  --   --  5.9*  --   --   --       ------------------------------------------------------------------------------------------------------------------  No results for input(s): CHOL, HDL, LDLCALC, TRIG, CHOLHDL, LDLDIRECT in the last 72 hours.  Lab Results  Component Value Date   HGBA1C 5.9 (H) 06/28/2020   ------------------------------------------------------------------------------------------------------------------ No results for input(s): TSH, T4TOTAL, T3FREE, THYROIDAB in the last 72 hours.  Invalid input(s): FREET3 ------------------------------------------------------------------------------------------------------------------  Cardiac Enzymes No results for input(s): CKMB, TROPONINI, MYOGLOBIN in the last 168 hours.  Invalid input(s): CK ------------------------------------------------------------------------------------------------------------------ No results found for: BNP  Micro Results Recent Results (from the past 240 hour(s))  Resp Panel by RT-PCR (Flu A&B, Covid) Nasopharyngeal Swab     Status: Abnormal   Collection Time: 06/28/20  3:52 AM   Specimen: Nasopharyngeal Swab; Nasopharyngeal(NP) swabs in vial transport medium  Result Value Ref Range Status   SARS Coronavirus 2 by RT PCR POSITIVE (A) NEGATIVE Final    Comment: RESULT CALLED TO, READ BACK BY AND VERIFIED WITH: KELLIE NEAL RN  06/28/2020 OLSONM (NOTE) SARS-CoV-2 target nucleic acids are DETECTED.  The SARS-CoV-2 RNA is generally detectable in upper respiratory specimens during the acute phase of infection. Positive results are indicative of the presence of the identified virus, but do not rule out bacterial infection or co-infection with other pathogens not detected by the test. Clinical correlation with patient history and other diagnostic information is necessary to determine patient infection status. The expected result is Negative.  Fact Sheet for Patients: BloggerCourse.com  Fact Sheet for  Healthcare Providers: SeriousBroker.it  This test is not yet approved or cleared by the [place] FDA and  has been authorized for detection and/or diagnosis of SARS-CoV-2 by FDA under an Emergency Use Authorization (EUA).  This EUA will remain in effect (meaning this test c an be used) for the duration of  the COVID-19 declaration under Section 564(b)(1) of the Act, 21 U.S.C. section 360bbb-3(b)(1), unless the authorization is terminated or revoked sooner.     Influenza A by PCR NEGATIVE NEGATIVE Final   Influenza B by PCR NEGATIVE NEGATIVE Final    Comment: (NOTE) The Xpert Xpress SARS-CoV-2/FLU/RSV plus assay is intended as an aid in the diagnosis of influenza from Nasopharyngeal swab specimens and should not be used as a sole basis for treatment. Nasal washings and aspirates are unacceptable for Xpert Xpress SARS-CoV-2/FLU/RSV testing.  Fact Sheet for Patients: BloggerCourse.com  Fact Sheet for Healthcare Providers: SeriousBroker.it  This test is not yet approved or cleared by the [place] FDA and has been authorized for detection and/or diagnosis of SARS-CoV-2 by FDA under an Emergency Use Authorization (EUA). This EUA will remain in effect (meaning this test can be used) for the duration of the COVID-19 declaration under Section 564(b)(1) of the Act, 21 U.S.C. section 360bbb-3(b)(1), unless the authorization is terminated or revoked.  Performed at [facility], [address] [place], [place] [address]   Blood culture (routine x 2)     Status: None (Preliminary result)   Collection Time: 06/28/20  4:18 AM   Specimen: BLOOD  Result Value Ref Range Status   Specimen Description   Final    BLOOD RIGHT ANTECUBITAL Performed at [facility], [address] [place], [place] [address]    Special Requests   Final    BOTTLES DRAWN AEROBIC AND ANAEROBIC Blood Culture  adequate volume Performed at [facility], [address] [place], [place] [address]    Culture   Final    NO GROWTH 3 DAYS Performed at [facility], [address], [place], [place] [address]  Report Status PENDING  Incomplete  Blood culture (routine x 2)     Status: None (Preliminary result)   Collection Time: 06/28/20  4:41 AM   Specimen: BLOOD  Result Value Ref Range Status   Specimen Description   Final    BLOOD LEFT ANTECUBITAL Performed at [facility], [address], [place], [place] [address]    Special Requests   Final    BOTTLES DRAWN AEROBIC AND ANAEROBIC Blood Culture adequate volume Performed at [facility], [address] [place], [place] [address]    Culture   Final    NO GROWTH 3 DAYS Performed at [facility], [address], [place], [place] [address]    Report Status PENDING  Incomplete    Radiology Reports DG Chest 2 View  Result Date: 06/28/2020 CLINICAL DATA:  Dyspnea EXAM: CHEST - 2 VIEW COMPARISON:  05/10/2020 FINDINGS: Lung volumes are extremely small, but are symmetric. There is superimposed extensive patchy perihilar pulmonary infiltrate, likely infectious or inflammatory in the acute setting. No pneumothorax or pleural effusion. Cardiac size within normal limits. IMPRESSION: Pulmonary hypoinflation. Interval development of extensive bilateral pulmonary infiltrates, likely infectious or inflammatory. Electronically Signed   By: Helyn Numbers MD   On: 06/28/2020 04:11

## 2020-07-01 NOTE — Progress Notes (Signed)
SATURATION QUALIFICATIONS: (This note is used to comply with regulatory documentation for home oxygen)  Patient Saturations on Room Air at Rest = 90%  Patient Saturations on Room Air while Ambulating 89-91%

## 2020-07-02 DIAGNOSIS — J1282 Pneumonia due to coronavirus disease 2019: Secondary | ICD-10-CM | POA: Diagnosis not present

## 2020-07-02 DIAGNOSIS — U071 COVID-19: Secondary | ICD-10-CM | POA: Diagnosis not present

## 2020-07-02 LAB — GLUCOSE, CAPILLARY
Glucose-Capillary: 89 mg/dL (ref 70–99)
Glucose-Capillary: 128 mg/dL — ABNORMAL HIGH (ref 70–99)

## 2020-07-02 MED ORDER — METHYLPREDNISOLONE 4 MG PO TBPK: ORAL_TABLET | ORAL | 0 refills | Status: DC

## 2020-07-02 MED ORDER — IPRATROPIUM-ALBUTEROL 20-100 MCG/ACT IN AERS: 1.0000 | INHALATION_SPRAY | Freq: Two times a day (BID) | RESPIRATORY_TRACT | Status: DC

## 2020-07-02 MED ORDER — ALBUTEROL SULFATE HFA 108 (90 BASE) MCG/ACT IN AERS: 2.0000 | INHALATION_SPRAY | Freq: Four times a day (QID) | RESPIRATORY_TRACT | 0 refills | Status: DC | PRN

## 2020-07-02 NOTE — Discharge Instructions (Signed)
Follow with Primary MD Jackie Plum, MD in 7 days   Get CBC, CMP, 2 view Chest X ray -  checked next visit within 1 week by Primary MD    Activity: As tolerated with Full fall precautions use walker/cane & assistance as needed  Disposition Home    Diet: Heart Healthy    Special Instructions: If you have smoked or chewed Tobacco  in the last 2 yrs please stop smoking, stop any regular Alcohol  and or any Recreational drug use.  On your next visit with your primary care physician please Get Medicines reviewed and adjusted.  Please request your Prim.MD to go over all Hospital Tests and Procedure/Radiological results at the follow up, please get all Hospital records sent to your Prim MD by signing hospital release before you go home.  If you experience worsening of your admission symptoms, develop shortness of breath, life threatening emergency, suicidal or homicidal thoughts you must seek medical attention immediately by calling 911 or calling your MD immediately  if symptoms less severe.  You Must read complete instructions/literature along with all the possible adverse reactions/side effects for all the Medicines you take and that have been prescribed to you. Take any new Medicines after you have completely understood and accpet all the possible adverse reactions/side effects.         Person Under Monitoring Name: Antonio Conner  Location: [address] [place] [address]   Infection Prevention Recommendations for Individuals Confirmed to have, or Being Evaluated for, 2019 Novel Coronavirus (COVID-19) Infection Who Receive Care at Home  Individuals who are confirmed to have, or are being evaluated for, COVID-19 should follow the prevention steps below until a healthcare provider or local or state health department says they can return to normal activities.  Stay home except to get medical care You should restrict activities outside your home, except for getting  medical care. Do not go to work, school, or public areas, and do not use public transportation or taxis.  Call ahead before visiting your doctor Before your medical appointment, call the healthcare provider and tell them that you have, or are being evaluated for, COVID-19 infection. This will help the healthcare provider's office take steps to keep other people from getting infected. Ask your healthcare provider to call the local or state health department.  Monitor your symptoms Seek prompt medical attention if your illness is worsening (e.g., difficulty breathing). Before going to your medical appointment, call the healthcare provider and tell them that you have, or are being evaluated for, COVID-19 infection. Ask your healthcare provider to call the local or state health department.  Wear a facemask You should wear a facemask that covers your nose and mouth when you are in the same room with other people and when you visit a healthcare provider. People who live with or visit you should also wear a facemask while they are in the same room with you.  Separate yourself from other people in your home As much as possible, you should stay in a different room from other people in your home. Also, you should use a separate bathroom, if available.  Avoid sharing household items You should not share dishes, drinking glasses, cups, eating utensils, towels, bedding, or other items with other people in your home. After using these items, you should wash them thoroughly with soap and water.  Cover your coughs and sneezes Cover your mouth and nose with a tissue when you cough or sneeze, or you can  cough or sneeze into your sleeve. Throw used tissues in a lined trash can, and immediately wash your hands with soap and water for at least 20 seconds or use an alcohol-based hand rub.  Wash your [facility] your hands often and thoroughly with soap and water for at least 20 seconds. You can use an  alcohol-based hand sanitizer if soap and water are not available and if your hands are not visibly dirty. Avoid touching your eyes, nose, and mouth with unwashed hands.   Prevention Steps for Caregivers and Household Members of Individuals Confirmed to have, or Being Evaluated for, COVID-19 Infection Being Cared for in the Home  If you live with, or provide care at home for, a person confirmed to have, or being evaluated for, COVID-19 infection please follow these guidelines to prevent infection:  Follow healthcare provider's instructions Make sure that you understand and can help the patient follow any healthcare provider instructions for all care.  Provide for the patient's basic needs You should help the patient with basic needs in the home and provide support for getting groceries, prescriptions, and other personal needs.  Monitor the patient's symptoms If they are getting sicker, call his or her medical provider and tell them that the patient has, or is being evaluated for, COVID-19 infection. This will help the healthcare provider's office take steps to keep other people from getting infected. Ask the healthcare provider to call the local or state health department.  Limit the number of people who have contact with the patient  If possible, have only one caregiver for the patient.  Other household members should stay in another home or place of residence. If this is not possible, they should stay  in another room, or be separated from the patient as much as possible. Use a separate bathroom, if available.  Restrict visitors who do not have an essential need to be in the home.  Keep older adults, very young children, and other sick people away from the patient Keep older adults, very young children, and those who have compromised immune systems or chronic health conditions away from the patient. This includes people with chronic heart, lung, or kidney conditions, diabetes, and  cancer.  Ensure good ventilation Make sure that shared spaces in the home have good air flow, such as from an air conditioner or an opened window, weather permitting.  Wash your hands often  Wash your hands often and thoroughly with soap and water for at least 20 seconds. You can use an alcohol based hand sanitizer if soap and water are not available and if your hands are not visibly dirty.  Avoid touching your eyes, nose, and mouth with unwashed hands.  Use disposable paper towels to dry your hands. If not available, use dedicated cloth towels and replace them when they become wet.  Wear a facemask and gloves  Wear a disposable facemask at all times in the room and gloves when you touch or have contact with the patient's blood, body fluids, and/or secretions or excretions, such as sweat, saliva, sputum, nasal mucus, vomit, urine, or feces.  Ensure the mask fits over your nose and mouth tightly, and do not touch it during use.  Throw out disposable facemasks and gloves after using them. Do not reuse.  Wash your hands immediately after removing your facemask and gloves.  If your personal clothing becomes contaminated, carefully remove clothing and launder. Wash your hands after handling contaminated clothing.  Place all used disposable facemasks, gloves, and  other waste in a lined container before disposing them with other household waste.  Remove gloves and wash your hands immediately after handling these items.  Do not share dishes, glasses, or other household items with the patient  Avoid sharing household items. You should not share dishes, drinking glasses, cups, eating utensils, towels, bedding, or other items with a patient who is confirmed to have, or being evaluated for, COVID-19 infection.  After the person uses these items, you should wash them thoroughly with soap and water.  Wash laundry thoroughly  Immediately remove and wash clothes or bedding that have blood, body  fluids, and/or secretions or excretions, such as sweat, saliva, sputum, nasal mucus, vomit, urine, or feces, on them.  Wear gloves when handling laundry from the patient.  Read and follow directions on labels of laundry or clothing items and detergent. In general, wash and dry with the warmest temperatures recommended on the label.  Clean all areas the individual has used often  Clean all touchable surfaces, such as counters, tabletops, doorknobs, bathroom fixtures, toilets, phones, keyboards, tablets, and bedside tables, every day. Also, clean any surfaces that may have blood, body fluids, and/or secretions or excretions on them.  Wear gloves when cleaning surfaces the patient has come in contact with.  Use a diluted bleach solution (e.g., dilute bleach with 1 part bleach and 10 parts water) or a household disinfectant with a label that says EPA-registered for coronaviruses. To make a bleach solution at home, add 1 tablespoon of bleach to 1 quart (4 cups) of water. For a larger supply, add  cup of bleach to 1 gallon (16 cups) of water.  Read labels of cleaning products and follow recommendations provided on product labels. Labels contain instructions for safe and effective use of the cleaning product including precautions you should take when applying the product, such as wearing gloves or eye protection and making sure you have good ventilation during use of the product.  Remove gloves and wash hands immediately after cleaning.  Monitor yourself for signs and symptoms of illness Caregivers and household members are considered close contacts, should monitor their health, and will be asked to limit movement outside of the home to the extent possible. Follow the monitoring steps for close contacts listed on the symptom monitoring form.   ? If you have additional questions, contact your local health department or call the epidemiologist on call at [phone] (available 24/7). ? This  guidance is subject to change. For the most up-to-date guidance from [facility], please refer to their website: YouBlogs.pl

## 2020-07-02 NOTE — Discharge Summary (Signed)
Antonio Conner HQP:[id number] DOB: 10/21/1985 DOA: 06/28/2020  PCP: Jackie Plum, MD  Admit date: 06/28/2020  Discharge date: 07/02/2020  Admitted From: Home   Disposition:  Home   Recommendations for Outpatient Follow-up:   Follow up with PCP in 1-2 weeks  PCP Please obtain BMP/CBC, 2 view CXR in 1week,  (see Discharge instructions)   PCP Please follow up on the following pending results: Check CBC, CMP and a two-view chest x-ray in 7 to 10 days.  Will need outpatient sleep study.   Home Health: None   Equipment/Devices: None  Consultations: None  Discharge Condition: Stable   CODE STATUS: Full    Diet Recommendation: Heart Healthy   Diet Order            Diet - low sodium heart healthy           Diet regular Room service appropriate? Yes; Fluid consistency: Thin  Diet effective now                  Chief Complaint  Patient presents with  . Shortness of Breath     Brief history of present illness from the day of admission and additional interim summary    Patient is a 34 y.o. male with PMHx of OSA-noncompliant to CPAP, morbid obesity-presenting with approximately 7-8-day history of myalgias, cough, worsening shortness of breath.  Found to have acute hypoxic respiratory failure due to COVID-19 pneumonia.  See below for further details.  COVID-19 vaccinated status: Unvaccinated  Significant Events: 12/1>> Admit to [facility] for hypoxia due to COVID-19 pneumonia  Significant studies: 12/1>>Chest x-ray: Extensive bilateral pulmonary infiltrates.  COVID-19 medications: Steroids: 12/1>> Remdesivir: 12/1>> Actemra: 12/2 x 1                                                                 Hospital Course   Acute Hypoxic Resp Failure due to Covid 19 Viral pneumonia: He is unfortunately not  vaccinated for COVID-19, he incurred severe parenchymal lung injury due to COVID-19 pneumonia and was appropriately treated with combination of steroids, Remdesivir and Actemra.  He has shown remarkable improvement and now stable and symptom-free on room air.  Upon ambulation no oxygen requirement.  Will be discharged home on Medrol Dosepak along with rescue inhaler, follow with PCP in a week.  Mild asymptomatic transaminitis due to COVID-19.  Symptom-free, repeat CMP in 7 to 10 days by PCP.  Morbid obesity with underlying OSA.  Follow with PCP for weight loss, will benefit from outpatient sleep study.  BMI is around 65.   Discharge diagnosis     Active Problems:   Pneumonia due to COVID-19 virus    Discharge instructions    Discharge Instructions    Diet - low sodium heart healthy   Complete by: As directed  Discharge instructions   Complete by: As directed    Follow with Primary MD Jackie Plum, MD in 7 days   Get CBC, CMP, 2 view Chest X ray -  checked next visit within 1 week by Primary MD    Activity: As tolerated with Full fall precautions use walker/cane & assistance as needed  Disposition Home    Diet: Heart Healthy    Special Instructions: If you have smoked or chewed Tobacco  in the last 2 yrs please stop smoking, stop any regular Alcohol  and or any Recreational drug use.  On your next visit with your primary care physician please Get Medicines reviewed and adjusted.  Please request your Prim.MD to go over all Hospital Tests and Procedure/Radiological results at the follow up, please get all Hospital records sent to your Prim MD by signing hospital release before you go home.  If you experience worsening of your admission symptoms, develop shortness of breath, life threatening emergency, suicidal or homicidal thoughts you must seek medical attention immediately by calling 911 or calling your MD immediately  if symptoms less severe.  You Must read complete  instructions/literature along with all the possible adverse reactions/side effects for all the Medicines you take and that have been prescribed to you. Take any new Medicines after you have completely understood and accpet all the possible adverse reactions/side effects.   Increase activity slowly   Complete by: As directed       Discharge Medications   Allergies as of 07/02/2020      Reactions   Hibiclens [chlorhexidine Gluconate] Hives, Itching   Lac Bovis Shortness Of Breath   Eggs Or Egg-derived Products Hives   Peanut Oil Itching, Swelling, Other (See Comments)   Throat and ears itch & swell   Influenza Vaccines Swelling, Other (See Comments)   Swelling of limbs   Morphine And Related Nausea And Vomiting, Other (See Comments)   "Got sick to my stomach and threw up"      Medication List    STOP taking these medications   ibuprofen 200 MG tablet Commonly known as: ADVIL     TAKE these medications   albuterol 108 (90 Base) MCG/ACT inhaler Commonly known as: VENTOLIN HFA Inhale 2 puffs into the lungs every 6 (six) hours as needed for wheezing or shortness of breath.   ascorbic acid 500 MG tablet Commonly known as: VITAMIN C Take 500 mg by mouth daily.   HYDROcodone-acetaminophen 5-325 MG tablet Commonly known as: NORCO/VICODIN Take 1 tablet by mouth every 6 (six) hours as needed for moderate pain.   magnesium 30 MG tablet Take 30 mg by mouth daily.   methocarbamol 500 MG tablet Commonly known as: Robaxin Take 1 tablet (500 mg total) by mouth every 6 (six) hours as needed for muscle spasms.   methylPREDNISolone 4 MG Tbpk tablet Commonly known as: MEDROL DOSEPAK follow package directions   Vitamin D3 50 MCG (2000 UT) Tabs Take 2,000 Units by mouth daily.   VITAMIN E PO Take 1 capsule by mouth daily.   Zinc 30 MG Tabs Take 30 mg by mouth daily.        Follow-up Information    Osei-Bonsu, Greggory Stallion, MD. Schedule an appointment as soon as possible for a visit  in 1 week(s).   Specialty: Internal Medicine Contact information: [address] [place] [address] [phone]               Major procedures and Radiology Reports -  PLEASE review detailed and final reports thoroughly  -       DG Chest 2 View  Result Date: 06/28/2020 CLINICAL DATA:  Dyspnea EXAM: CHEST - 2 VIEW COMPARISON:  05/10/2020 FINDINGS: Lung volumes are extremely small, but are symmetric. There is superimposed extensive patchy perihilar pulmonary infiltrate, likely infectious or inflammatory in the acute setting. No pneumothorax or pleural effusion. Cardiac size within normal limits. IMPRESSION: Pulmonary hypoinflation. Interval development of extensive bilateral pulmonary infiltrates, likely infectious or inflammatory. Electronically Signed   By: Helyn Numbers MD   On: 06/28/2020 04:11    Micro Results     Recent Results (from the past 240 hour(s))  Resp Panel by RT-PCR (Flu A&B, Covid) Nasopharyngeal Swab     Status: Abnormal   Collection Time: 06/28/20  3:52 AM   Specimen: Nasopharyngeal Swab; Nasopharyngeal(NP) swabs in vial transport medium  Result Value Ref Range Status   SARS Coronavirus 2 by RT PCR POSITIVE (A) NEGATIVE Final    Comment: RESULT CALLED TO, READ BACK BY AND VERIFIED WITH: KELLIE NEAL RN @0442  06/28/2020 OLSONM (NOTE) SARS-CoV-2 target nucleic acids are DETECTED.  The SARS-CoV-2 RNA is generally detectable in upper respiratory specimens during the acute phase of infection. Positive results are indicative of the presence of the identified virus, but do not rule out bacterial infection or co-infection with other pathogens not detected by the test. Clinical correlation with patient history and other diagnostic information is necessary to determine patient infection status. The expected result is Negative.  Fact Sheet for Patients: 14/07/2019  Fact Sheet for Healthcare  Providers: BloggerCourse.com  This test is not yet approved or cleared by the [employer].[place] FDA and  has been authorized for detection and/or diagnosis of SARS-CoV-2 by FDA under an Emergency Use Authorization (EUA).  This EUA will remain in effect (meaning this test c an be used) for the duration of  the COVID-19 declaration under Section 564(b)(1) of the Act, 21 U.S.C. section 360bbb-3(b)(1), unless the authorization is terminated or revoked sooner.     Influenza A by PCR NEGATIVE NEGATIVE Final   Influenza B by PCR NEGATIVE NEGATIVE Final    Comment: (NOTE) The Xpert Xpress SARS-CoV-2/FLU/RSV plus assay is intended as an aid in the diagnosis of influenza from Nasopharyngeal swab specimens and should not be used as a sole basis for treatment. Nasal washings and aspirates are unacceptable for Xpert Xpress SARS-CoV-2/FLU/RSV testing.  Fact Sheet for Patients: [place]  Fact Sheet for Healthcare Providers: BloggerCourse.com  This test is not yet approved or cleared by the SeriousBroker.it FDA and has been authorized for detection and/or diagnosis of SARS-CoV-2 by FDA under an Emergency Use Authorization (EUA). This EUA will remain in effect (meaning this test can be used) for the duration of the COVID-19 declaration under Section 564(b)(1) of the Act, 21 U.S.C. section 360bbb-3(b)(1), unless the authorization is terminated or revoked.  Performed at [facility], [address] [place], [place]   Blood culture (routine x 2)     Status: None (Preliminary result)   Collection Time: 06/28/20  4:18 AM   Specimen: BLOOD  Result Value Ref Range Status   Specimen Description   Final    BLOOD RIGHT ANTECUBITAL Performed at [facility], [address], [place], [place]    Special Requests   Final    BOTTLES DRAWN AEROBIC AND ANAEROBIC Blood Culture adequate  volume Performed at [facility], [address] [place], [place] [address]    Culture   Final    NO GROWTH 4 DAYS Performed at [facility], [address] [place], [place] [address]    Report Status PENDING  Incomplete  Blood culture (routine x 2)     Status: None (Preliminary result)   Collection Time: 06/28/20  4:41 AM   Specimen: BLOOD  Result Value Ref Range Status   Specimen Description   Final    BLOOD LEFT ANTECUBITAL Performed at [facility], [address] [place], [place] [address]    Special Requests   Final    BOTTLES DRAWN AEROBIC AND ANAEROBIC Blood Culture adequate volume Performed at [facility] [place], [address] [place], [place] [address]    Culture   Final    NO GROWTH 4 DAYS Performed at [facility], [address] [place], [place] [address]    Report Status PENDING  Incomplete    Today   Subjective    Antonio AmassMiguel Conner today has no headache,no chest abdominal pain,no new weakness tingling or numbness, feels much better wants to go home today.     Objective   Blood pressure 126/81, pulse 66, temperature 97.7 F (36.5 C), temperature source Oral, resp. rate 17, height 5\' 9"  (1.753 m), weight (!) 201 kg, SpO2 94 %.  No intake or output data in the 24 hours ending 07/02/20 0923  Exam  Awake Alert, No new F.N deficits, Normal affect [place].AT,PERRAL Supple Neck,No JVD, No cervical lymphadenopathy appriciated.  Symmetrical Chest wall movement, Good air movement bilaterally, CTAB RRR,No Gallops,Rubs or new Murmurs, No Parasternal Heave +ve B.Sounds, Abd Soft, Non tender, No organomegaly appriciated, No rebound -guarding or rigidity. No Cyanosis, Clubbing or edema, No new Rash or bruise   Data Review   CBC w Diff:  Lab Results  Component Value Date   WBC 6.2 07/01/2020   HGB 12.2 (L) 07/01/2020   HCT 38.7 (L) 07/01/2020   PLT 294 07/01/2020   LYMPHOPCT 13 07/01/2020   MONOPCT 7 07/01/2020   EOSPCT 0  07/01/2020   BASOPCT 0 07/01/2020    CMP:  Lab Results  Component Value Date   NA 140 07/01/2020   K 4.6 07/01/2020   CL 103 07/01/2020   CO2 26 07/01/2020   BUN 17 07/01/2020   CREATININE 1.17 07/01/2020   PROT 7.1 07/01/2020   ALBUMIN 3.2 (L) 07/01/2020   BILITOT 0.6 07/01/2020   ALKPHOS 55 07/01/2020   AST 105 (H) 07/01/2020   ALT 100 (H) 07/01/2020  .   Total Time in preparing paper work, data evaluation and todays exam - 35 minutes  Susa RaringPrashant Haila Dena M.D on 07/02/2020 at 9:23 AM  [facility]   Office  [phone]

## 2020-07-03 LAB — CULTURE, BLOOD (ROUTINE X 2)
Culture: NO GROWTH
Culture: NO GROWTH
Special Requests: ADEQUATE
Special Requests: ADEQUATE

## 2021-02-17 ENCOUNTER — Observation Stay (HOSPITAL_COMMUNITY): Payer: Self-pay | Admitting: Certified Registered Nurse Anesthetist

## 2021-02-17 ENCOUNTER — Emergency Department (HOSPITAL_COMMUNITY): Payer: Self-pay

## 2021-02-17 ENCOUNTER — Encounter (HOSPITAL_BASED_OUTPATIENT_CLINIC_OR_DEPARTMENT_OTHER): Payer: Self-pay | Admitting: Emergency Medicine

## 2021-02-17 ENCOUNTER — Emergency Department (HOSPITAL_BASED_OUTPATIENT_CLINIC_OR_DEPARTMENT_OTHER): Payer: Self-pay

## 2021-02-17 ENCOUNTER — Observation Stay (HOSPITAL_BASED_OUTPATIENT_CLINIC_OR_DEPARTMENT_OTHER)
Admission: EM | Admit: 2021-02-17 | Discharge: 2021-02-18 | Disposition: A | Discharge status: Home / Self Care | Payer: Self-pay | Attending: Surgery | Admitting: Surgery

## 2021-02-17 ENCOUNTER — Encounter (HOSPITAL_COMMUNITY)
Admission: EM | Disposition: A | Discharge status: Home / Self Care | Payer: Self-pay | Source: Home / Self Care | Attending: Emergency Medicine

## 2021-02-17 ENCOUNTER — Other Ambulatory Visit: Payer: Self-pay

## 2021-02-17 DIAGNOSIS — Z8616 Personal history of COVID-19: Secondary | ICD-10-CM | POA: Insufficient documentation

## 2021-02-17 DIAGNOSIS — K8012 Calculus of gallbladder with acute and chronic cholecystitis without obstruction: Principal | ICD-10-CM | POA: Insufficient documentation

## 2021-02-17 DIAGNOSIS — K819 Cholecystitis, unspecified: Secondary | ICD-10-CM

## 2021-02-17 DIAGNOSIS — Z91010 Allergy to peanuts: Secondary | ICD-10-CM | POA: Insufficient documentation

## 2021-02-17 DIAGNOSIS — K219 Gastro-esophageal reflux disease without esophagitis: Secondary | ICD-10-CM | POA: Insufficient documentation

## 2021-02-17 DIAGNOSIS — R000 Tachycardia, unspecified: Secondary | ICD-10-CM | POA: Insufficient documentation

## 2021-02-17 DIAGNOSIS — K82A1 Gangrene of gallbladder in cholecystitis: Secondary | ICD-10-CM | POA: Insufficient documentation

## 2021-02-17 DIAGNOSIS — R1013 Epigastric pain: Secondary | ICD-10-CM

## 2021-02-17 DIAGNOSIS — Z20822 Contact with and (suspected) exposure to covid-19: Secondary | ICD-10-CM | POA: Insufficient documentation

## 2021-02-17 DIAGNOSIS — K8000 Calculus of gallbladder with acute cholecystitis without obstruction: Secondary | ICD-10-CM | POA: Diagnosis present

## 2021-02-17 HISTORY — PX: CHOLECYSTECTOMY: SHX55

## 2021-02-17 LAB — CBC
HCT: 40.3 % (ref 39.0–52.0)
Hemoglobin: 13.3 g/dL (ref 13.0–17.0)
MCH: 27.2 pg (ref 26.0–34.0)
MCHC: 33 g/dL (ref 30.0–36.0)
MCV: 82.4 fL (ref 80.0–100.0)
Platelets: 254 10*3/uL (ref 150–400)
RBC: 4.89 MIL/uL (ref 4.22–5.81)
RDW: 16.2 % — ABNORMAL HIGH (ref 11.5–15.5)
WBC: 11.6 10*3/uL — ABNORMAL HIGH (ref 4.0–10.5)
nRBC: 0 % (ref 0.0–0.2)

## 2021-02-17 LAB — RESP PANEL BY RT-PCR (FLU A&B, COVID) ARPGX2
Influenza A by PCR: NEGATIVE
Influenza B by PCR: NEGATIVE
SARS Coronavirus 2 by RT PCR: NEGATIVE

## 2021-02-17 LAB — URINALYSIS, ROUTINE W REFLEX MICROSCOPIC
Bilirubin Urine: NEGATIVE
Glucose, UA: NEGATIVE mg/dL
Hgb urine dipstick: NEGATIVE
Ketones, ur: NEGATIVE mg/dL
Leukocytes,Ua: NEGATIVE
Nitrite: NEGATIVE
Protein, ur: 100 mg/dL — AB
Specific Gravity, Urine: >1.046 — ABNORMAL HIGH (ref 1.005–1.030)
pH: 5 (ref 5.0–8.0)

## 2021-02-17 LAB — LIPASE, BLOOD: Lipase: 32 U/L (ref 11–51)

## 2021-02-17 LAB — TROPONIN I (HIGH SENSITIVITY)
Troponin I (High Sensitivity): 4 ng/L (ref <18)
Troponin I (High Sensitivity): 3 ng/L (ref <18)

## 2021-02-17 LAB — COMPREHENSIVE METABOLIC PANEL
ALT: 25 U/L (ref 0–44)
AST: 20 U/L (ref 15–41)
Albumin: 4.2 g/dL (ref 3.5–5.0)
Alkaline Phosphatase: 97 U/L (ref 38–126)
Anion gap: 11 (ref 5–15)
BUN: 15 mg/dL (ref 6–20)
CO2: 25 mmol/L (ref 22–32)
Calcium: 9.1 mg/dL (ref 8.9–10.3)
Chloride: 103 mmol/L (ref 98–111)
Creatinine, Ser: 1.12 mg/dL (ref 0.61–1.24)
GFR, Estimated: >60 mL/min (ref >60)
Glucose, Bld: 141 mg/dL — ABNORMAL HIGH (ref 70–99)
Potassium: 3.8 mmol/L (ref 3.5–5.1)
Sodium: 139 mmol/L (ref 135–145)
Total Bilirubin: 0.1 mg/dL — ABNORMAL LOW (ref 0.3–1.2)
Total Protein: 8 g/dL (ref 6.5–8.1)

## 2021-02-17 SURGERY — LAPAROSCOPIC CHOLECYSTECTOMY
Anesthesia: General

## 2021-02-17 MED ORDER — LIDOCAINE 2% (20 MG/ML) 5 ML SYRINGE
INTRAMUSCULAR | Status: DC | PRN
  Administered 2021-02-17: 100 mg via INTRAVENOUS

## 2021-02-17 MED ORDER — LACTATED RINGERS IV SOLN: INTRAVENOUS | Status: DC

## 2021-02-17 MED ORDER — PROPOFOL 10 MG/ML IV BOLUS
INTRAVENOUS | Status: AC
  Filled 2021-02-17: qty 20

## 2021-02-17 MED ORDER — PIPERACILLIN-TAZOBACTAM 3.375 G IVPB 30 MIN
3.3750 g | Freq: Once | INTRAVENOUS | Status: DC
  Filled 2021-02-17: qty 50

## 2021-02-17 MED ORDER — SUCCINYLCHOLINE CHLORIDE 200 MG/10ML IV SOSY
PREFILLED_SYRINGE | INTRAVENOUS | Status: DC | PRN
  Administered 2021-02-17: 180 mg via INTRAVENOUS

## 2021-02-17 MED ORDER — SCOPOLAMINE 1 MG/3DAYS TD PT72: 1.0000 | MEDICATED_PATCH | TRANSDERMAL | Status: DC

## 2021-02-17 MED ORDER — KETOROLAC TROMETHAMINE 15 MG/ML IJ SOLN: 15.0000 mg | INTRAMUSCULAR | Status: AC

## 2021-02-17 MED ORDER — BUPIVACAINE HCL 0.25 % IJ SOLN
INTRAMUSCULAR | Status: DC | PRN
  Administered 2021-02-17: 30 mL

## 2021-02-17 MED ORDER — PHENYLEPHRINE HCL-NACL 10-0.9 MG/250ML-% IV SOLN
INTRAVENOUS | Status: DC | PRN
  Administered 2021-02-17: 50 ug/min via INTRAVENOUS

## 2021-02-17 MED ORDER — FENTANYL CITRATE (PF) 100 MCG/2ML IJ SOLN
50.0000 ug | Freq: Once | INTRAMUSCULAR | Status: AC
  Administered 2021-02-17: 50 ug via INTRAVENOUS
  Filled 2021-02-17: qty 2

## 2021-02-17 MED ORDER — PHENYLEPHRINE 40 MCG/ML (10ML) SYRINGE FOR IV PUSH (FOR BLOOD PRESSURE SUPPORT)
PREFILLED_SYRINGE | INTRAVENOUS | Status: DC | PRN
  Administered 2021-02-17 (×4): 120 ug via INTRAVENOUS

## 2021-02-17 MED ORDER — HYDROMORPHONE HCL 1 MG/ML IJ SOLN
1.0000 mg | INTRAMUSCULAR | Status: DC | PRN
  Administered 2021-02-17 – 2021-02-18 (×2): 1 mg via INTRAVENOUS
  Filled 2021-02-17 (×3): qty 1

## 2021-02-17 MED ORDER — FENTANYL CITRATE (PF) 100 MCG/2ML IJ SOLN
100.0000 ug | Freq: Once | INTRAMUSCULAR | Status: AC
  Administered 2021-02-17: 100 ug via INTRAVENOUS
  Filled 2021-02-17: qty 2

## 2021-02-17 MED ORDER — ONDANSETRON 4 MG PO TBDP
4.0000 mg | ORAL_TABLET | Freq: Once | ORAL | Status: AC
  Administered 2021-02-17: 4 mg via ORAL
  Filled 2021-02-17: qty 1

## 2021-02-17 MED ORDER — KETOROLAC TROMETHAMINE 15 MG/ML IJ SOLN
INTRAMUSCULAR | Status: AC
  Administered 2021-02-17: 15 mg via INTRAVENOUS
  Filled 2021-02-17: qty 1

## 2021-02-17 MED ORDER — FENTANYL CITRATE (PF) 250 MCG/5ML IJ SOLN
INTRAMUSCULAR | Status: DC | PRN
  Administered 2021-02-17: 25 ug via INTRAVENOUS
  Administered 2021-02-17: 50 ug via INTRAVENOUS
  Administered 2021-02-17: 25 ug via INTRAVENOUS
  Administered 2021-02-17: 50 ug via INTRAVENOUS
  Administered 2021-02-17: 100 ug via INTRAVENOUS

## 2021-02-17 MED ORDER — MIDAZOLAM HCL 2 MG/2ML IJ SOLN
INTRAMUSCULAR | Status: DC | PRN
  Administered 2021-02-17: 2 mg via INTRAVENOUS

## 2021-02-17 MED ORDER — ONDANSETRON HCL 4 MG/2ML IJ SOLN: 4.0000 mg | INTRAMUSCULAR | Status: DC | PRN

## 2021-02-17 MED ORDER — HALOPERIDOL LACTATE 5 MG/ML IJ SOLN
2.0000 mg | Freq: Once | INTRAMUSCULAR | Status: AC
  Administered 2021-02-17: 2 mg via INTRAVENOUS
  Filled 2021-02-17: qty 1

## 2021-02-17 MED ORDER — ONDANSETRON HCL 4 MG/2ML IJ SOLN
4.0000 mg | Freq: Once | INTRAMUSCULAR | Status: AC
  Administered 2021-02-17: 4 mg via INTRAVENOUS

## 2021-02-17 MED ORDER — PROPOFOL 10 MG/ML IV BOLUS
INTRAVENOUS | Status: DC | PRN
  Administered 2021-02-17: 300 mg via INTRAVENOUS

## 2021-02-17 MED ORDER — ONDANSETRON 4 MG PO TBDP: 4.0000 mg | ORAL_TABLET | Freq: Four times a day (QID) | ORAL | Status: DC | PRN

## 2021-02-17 MED ORDER — SCOPOLAMINE 1 MG/3DAYS TD PT72
MEDICATED_PATCH | TRANSDERMAL | Status: AC
  Administered 2021-02-17: 1.5 mg via TRANSDERMAL
  Filled 2021-02-17: qty 1

## 2021-02-17 MED ORDER — DEXAMETHASONE SODIUM PHOSPHATE 10 MG/ML IJ SOLN
INTRAMUSCULAR | Status: DC | PRN
  Administered 2021-02-17: 10 mg via INTRAVENOUS

## 2021-02-17 MED ORDER — BUPIVACAINE HCL (PF) 0.25 % IJ SOLN
INTRAMUSCULAR | Status: AC
  Filled 2021-02-17: qty 30

## 2021-02-17 MED ORDER — FENTANYL CITRATE (PF) 100 MCG/2ML IJ SOLN: 50.0000 ug | INTRAMUSCULAR | Status: DC | PRN

## 2021-02-17 MED ORDER — SODIUM CHLORIDE 0.9 % IR SOLN
Status: DC | PRN
  Administered 2021-02-17: 1000 mL

## 2021-02-17 MED ORDER — DEXTROSE 5 % IV SOLN
INTRAVENOUS | Status: DC | PRN
  Administered 2021-02-17: 3 g via INTRAVENOUS

## 2021-02-17 MED ORDER — IOHEXOL 350 MG/ML SOLN
100.0000 mL | Freq: Once | INTRAVENOUS | Status: AC | PRN
  Administered 2021-02-17: 100 mL via INTRAVENOUS

## 2021-02-17 MED ORDER — ORAL CARE MOUTH RINSE
15.0000 mL | Freq: Once | OROMUCOSAL | Status: AC
  Administered 2021-02-17: 15 mL via OROMUCOSAL

## 2021-02-17 MED ORDER — SODIUM CHLORIDE 0.9 % IV SOLN
2.0000 g | INTRAVENOUS | Status: DC
  Filled 2021-02-17 (×2): qty 20

## 2021-02-17 MED ORDER — MIDAZOLAM HCL 2 MG/2ML IJ SOLN
INTRAMUSCULAR | Status: AC
  Filled 2021-02-17: qty 2

## 2021-02-17 MED ORDER — ONDANSETRON HCL 4 MG/2ML IJ SOLN
4.0000 mg | Freq: Four times a day (QID) | INTRAMUSCULAR | Status: DC | PRN
Start: 2021-02-17 — End: 2021-02-18

## 2021-02-17 MED ORDER — SUGAMMADEX SODIUM 200 MG/2ML IV SOLN
INTRAVENOUS | Status: DC | PRN
  Administered 2021-02-17: 400 mg via INTRAVENOUS

## 2021-02-17 MED ORDER — DIPHENHYDRAMINE HCL 50 MG/ML IJ SOLN: 25.0000 mg | Freq: Four times a day (QID) | INTRAMUSCULAR | Status: DC | PRN

## 2021-02-17 MED ORDER — CHLORHEXIDINE GLUCONATE 0.12 % MT SOLN: 15.0000 mL | Freq: Once | OROMUCOSAL | Status: AC

## 2021-02-17 MED ORDER — DIPHENHYDRAMINE HCL 25 MG PO CAPS: 25.0000 mg | ORAL_CAPSULE | Freq: Four times a day (QID) | ORAL | Status: DC | PRN

## 2021-02-17 MED ORDER — 0.9 % SODIUM CHLORIDE (POUR BTL) OPTIME
TOPICAL | Status: DC | PRN
  Administered 2021-02-17: 1000 mL

## 2021-02-17 MED ORDER — ONDANSETRON HCL 4 MG/2ML IJ SOLN
INTRAMUSCULAR | Status: DC | PRN
Start: 2021-02-17 — End: 2021-02-17
  Administered 2021-02-17: 4 mg via INTRAVENOUS

## 2021-02-17 MED ORDER — LACTATED RINGERS IV BOLUS: 1000.0000 mL | Freq: Once | INTRAVENOUS | Status: DC

## 2021-02-17 MED ORDER — FAMOTIDINE IN NACL 20-0.9 MG/50ML-% IV SOLN
20.0000 mg | Freq: Once | INTRAVENOUS | Status: AC
  Administered 2021-02-17: 20 mg via INTRAVENOUS
  Filled 2021-02-17: qty 50

## 2021-02-17 MED ORDER — ROCURONIUM BROMIDE 10 MG/ML (PF) SYRINGE
PREFILLED_SYRINGE | INTRAVENOUS | Status: DC | PRN
  Administered 2021-02-17: 30 mg via INTRAVENOUS
  Administered 2021-02-17: 40 mg via INTRAVENOUS

## 2021-02-17 MED ORDER — PANTOPRAZOLE SODIUM 40 MG IV SOLR
40.0000 mg | Freq: Once | INTRAVENOUS | Status: AC
  Administered 2021-02-17: 40 mg via INTRAVENOUS
  Filled 2021-02-17: qty 40

## 2021-02-17 MED ORDER — ALUM & MAG HYDROXIDE-SIMETH 200-200-20 MG/5ML PO SUSP
30.0000 mL | Freq: Once | ORAL | Status: AC
  Administered 2021-02-17: 30 mL via ORAL
  Filled 2021-02-17: qty 30

## 2021-02-17 MED ORDER — FENTANYL CITRATE (PF) 250 MCG/5ML IJ SOLN
INTRAMUSCULAR | Status: AC
  Filled 2021-02-17: qty 5

## 2021-02-17 SURGICAL SUPPLY — 40 items
APPLIER CLIP 5 13 M/L LIGAMAX5 (MISCELLANEOUS) ×2
BAG COUNTER SPONGE SURGICOUNT (BAG) ×2 IMPLANT
CANISTER SUCT 3000ML PPV (MISCELLANEOUS) ×2 IMPLANT
CHLORAPREP W/TINT 26 (MISCELLANEOUS) ×2 IMPLANT
CLIP APPLIE 5 13 M/L LIGAMAX5 (MISCELLANEOUS) ×1 IMPLANT
COVER SURGICAL LIGHT HANDLE (MISCELLANEOUS) ×2 IMPLANT
DERMABOND ADVANCED (GAUZE/BANDAGES/DRESSINGS) ×1
DERMABOND ADVANCED .7 DNX12 (GAUZE/BANDAGES/DRESSINGS) ×1 IMPLANT
ELECT REM PT RETURN 9FT ADLT (ELECTROSURGICAL) ×2
ELECTRODE REM PT RTRN 9FT ADLT (ELECTROSURGICAL) ×1 IMPLANT
EVACUATOR SILICONE 100CC (DRAIN) ×2 IMPLANT
GLOVE SRG 8 PF TXTR STRL LF DI (GLOVE) ×1 IMPLANT
GLOVE SURG ENC MOIS LTX SZ7.5 (GLOVE) ×2 IMPLANT
GLOVE SURG UNDER POLY LF SZ8 (GLOVE) ×1
GOWN STRL REUS W/ TWL LRG LVL3 (GOWN DISPOSABLE) ×2 IMPLANT
GOWN STRL REUS W/ TWL XL LVL3 (GOWN DISPOSABLE) ×1 IMPLANT
GOWN STRL REUS W/TWL LRG LVL3 (GOWN DISPOSABLE) ×2
GOWN STRL REUS W/TWL XL LVL3 (GOWN DISPOSABLE) ×1
GRASPER SUT TROCAR 14GX15 (MISCELLANEOUS) ×2 IMPLANT
KIT BASIN OR (CUSTOM PROCEDURE TRAY) ×2 IMPLANT
KIT TURNOVER KIT B (KITS) ×2 IMPLANT
NEEDLE INSUFFLATION 14GA 120MM (NEEDLE) ×2 IMPLANT
NS IRRIG 1000ML POUR BTL (IV SOLUTION) ×2 IMPLANT
PAD ARMBOARD 7.5X6 YLW CONV (MISCELLANEOUS) ×2 IMPLANT
POUCH RETRIEVAL ECOSAC 10 (ENDOMECHANICALS) ×1 IMPLANT
POUCH RETRIEVAL ECOSAC 10MM (ENDOMECHANICALS) ×1
SCISSORS LAP 5X35 DISP (ENDOMECHANICALS) ×2 IMPLANT
SET IRRIG TUBING LAPAROSCOPIC (IRRIGATION / IRRIGATOR) ×2 IMPLANT
SET TUBE SMOKE EVAC HIGH FLOW (TUBING) ×2 IMPLANT
SLEEVE ENDOPATH XCEL 5M (ENDOMECHANICALS) ×4 IMPLANT
SPECIMEN JAR SMALL (MISCELLANEOUS) ×2 IMPLANT
SUT ETHILON 3 0 FSL (SUTURE) ×2 IMPLANT
SUT MNCRL AB 4-0 PS2 18 (SUTURE) ×2 IMPLANT
TOWEL GREEN STERILE (TOWEL DISPOSABLE) ×2 IMPLANT
TOWEL GREEN STERILE FF (TOWEL DISPOSABLE) ×2 IMPLANT
TRAY LAPAROSCOPIC MC (CUSTOM PROCEDURE TRAY) ×2 IMPLANT
TROCAR XCEL NON-BLD 11X100MML (ENDOMECHANICALS) ×2 IMPLANT
TROCAR XCEL NON-BLD 5MMX100MML (ENDOMECHANICALS) ×2 IMPLANT
WARMER LAPAROSCOPE (MISCELLANEOUS) ×2 IMPLANT
WATER STERILE IRR 1000ML POUR (IV SOLUTION) ×2 IMPLANT

## 2021-02-17 NOTE — ED Notes (Signed)
Pt going from [place] to [facility]

## 2021-02-17 NOTE — ED Provider Notes (Signed)
[facility] Emergency Department Provider Note MRN:  [id number]  Arrival date & time: 02/17/21     Chief Complaint   Abdominal Pain   History of Present Illness   Antonio Conner is a 35 y.o. year-old male with a history of GERD, obesity presenting to the ED with chief complaint of abdominal.  Location: Epigastric Duration: A few hours Onset: Sudden Timing: Constant Description: Sharp Severity: 10 out of 10 Exacerbating/Alleviating Factors: None Associated Symptoms: Nausea, dry heaving Pertinent Negatives: No recent fever or cough, no chest pain or shortness of breath, no lower abdominal pain   Review of Systems  A complete 10 system review of systems was obtained and all systems are negative except as noted in the HPI and PMH.   Patient's Health History    Past Medical History:  Diagnosis Date   Arthritis    GERD (gastroesophageal reflux disease)    Lateral meniscus tear right knee   Morbid obesity (HCC)    OSA (obstructive sleep apnea)    Not currently using CPAP    Past Surgical History:  Procedure Laterality Date   KNEE ARTHROSCOPY Right 05/31/2019   Procedure: Right knee arthroscopy with lateral meniscal debridement;  Surgeon: Ollen Gross, MD;  Location: [facility];  Service: Orthopedics;  Laterality: Right;    KNEE ARTHROSCOPY Right 05/08/2020   Procedure: Right knee arthroscopy, lateral meniscal debridement;  Surgeon: Ollen Gross, MD;  Location: [facility];  Service: Orthopedics;  Laterality: Right;    LAPAROSCOPIC GASTRIC BAND REMOVAL WITH LAPAROSCOPIC GASTRIC SLEEVE RESECTION  03/2015   WISDOM TOOTH EXTRACTION  2010    History reviewed. No pertinent family history.  Social History   Socioeconomic History   Marital status: Single    Spouse name: Not on file   Number of children: Not on file   Years of education: Not on file   Highest education level: Not on file  Occupational History   Not on file  Tobacco Use    Smoking status: Never   Smokeless tobacco: Never  Vaping Use   Vaping Use: Never used  Substance and Sexual Activity   Alcohol use: No   Drug use: No   Sexual activity: Not on file  Other Topics Concern   Not on file  Social History Narrative   Not on file   Social Determinants of Health   Financial Resource Strain: Not on file  Food Insecurity: Not on file  Transportation Needs: Not on file  Physical Activity: Not on file  Stress: Not on file  Social Connections: Not on file  Intimate Partner Violence: Not on file     Physical Exam   Vitals:   02/17/21 0245 02/17/21 0431  BP: (!) 144/104 140/85  Pulse: 84 89  Resp: 20 16  Temp: 98.1 F (36.7 C)   SpO2: 99% 94%    CONSTITUTIONAL: Well-appearing, in moderate distress due to discomfort NEURO:  Alert and oriented x 3, no focal deficits EYES:  eyes equal and reactive ENT/NECK:  no LAD, no JVD CARDIO: Regular rate, well-perfused, normal S1 and S2 PULM:  CTAB no wheezing or rhonchi GI/GU:  normal bowel sounds, non-distended, non-tender MSK/SPINE:  No gross deformities, no edema SKIN:  no rash, atraumatic PSYCH:  Appropriate speech and behavior  *Additional and/or pertinent findings included in MDM below  Diagnostic and Interventional Summary    EKG Interpretation  Date/Time:  Saturday February 17 2021 02:57:34 EDT Ventricular Rate:  83 PR Interval:  148 QRS  Duration: 92 QT Interval:  373 QTC Calculation: 439 R Axis:   -29 Text Interpretation: Sinus rhythm Borderline left axis deviation Probable anteroseptal infarct, old Confirmed by Kennis Carina [phone]) on 02/17/2021 3:00:37 AM       Labs Reviewed  CBC - Abnormal; Notable for the following components:      Result Value   WBC 11.6 (*)    RDW 16.2 (*)    All other components within normal limits  COMPREHENSIVE METABOLIC PANEL - Abnormal; Notable for the following components:   Glucose, Bld 141 (*)    Total Bilirubin 0.1 (*)    All other components within  normal limits  LIPASE, BLOOD  TROPONIN I (HIGH SENSITIVITY)  TROPONIN I (HIGH SENSITIVITY)    DG Chest Port 1 View  Final Result    CT Angio Chest/Abd/Pel for Dissection W and/or Wo Contrast    (Results Pending)    Medications  alum & mag hydroxide-simeth (MAALOX/MYLANTA) 200-200-20 MG/5ML suspension 30 mL (30 mLs Oral Given 02/17/21 0308)  ondansetron (ZOFRAN-ODT) disintegrating tablet 4 mg (4 mg Oral Given 02/17/21 0259)  famotidine (PEPCID) IVPB 20 mg premix (0 mg Intravenous Stopped 02/17/21 0425)  fentaNYL (SUBLIMAZE) injection 50 mcg (50 mcg Intravenous Given 02/17/21 0305)  fentaNYL (SUBLIMAZE) injection 100 mcg (100 mcg Intravenous Given 02/17/21 0424)     Procedures  /  Critical Care Procedures  ED Course and Medical Decision Making  I have reviewed the triage vital signs, the nursing notes, and pertinent available records from the EMR.  Listed above are laboratory and imaging tests that I personally ordered, reviewed, and interpreted and then considered in my medical decision making (see below for details).  Suspect GERD or GI related pain, also considering pancreatitis, less likely biliary colic.  Cardiac related pain unlikely but a possibility, obtaining screening labs, troponin, EKG, chest x-ray.     Labs thus far reassuring, despite multiple medications patient continues to have severe 10 out of 10 pain.  Considering perforated viscus, aortic dissection.  CT ordered.  5 AM update: CT scanner here at [facility] is not working.  Will require in person technician, who will not arrive for 2 hours.  Dr. Bebe Shaggy of [facility] emergency department has accepted patient for transfer so we can obtain a CT scan to exclude emergent process.  Elmer Sow. Pilar Plate, MD [facility] Emergency Medicine [facility] [email or web address]  Final Clinical Impressions(s) / ED Diagnoses     ICD-10-CM   1. Epigastric pain  R10.13       ED Discharge Orders     None         Discharge Instructions Discussed with and Provided to Patient:   Discharge Instructions   None       Sabas Sous, MD 02/17/21 [phone]

## 2021-02-17 NOTE — ED Notes (Signed)
Pt placed on 2LNC due to narcotic admin; respirations WDL; EDP and RT informed

## 2021-02-17 NOTE — H&P (Addendum)
[facility]  Admission Note   Helen Cuff [birth year]  [id number].     Requesting MD: Dr. Vanita Ingles Chief Complaint: Epigastric pain Reason for Consult: cholecystitis   HPI:  Patient is a morbidly obese male with a history of obstructive sleep apnea, GERD and COVID 07/02/2020, who is transferred from [facility] to [facility] for evaluation.  Current oxygen supplement after being a pain medication, he is now off the O2.   He complained of severe pain to palpation around the epigastrium and the right upper quadrant.  The lower abdomen was nontender.  Exam now shows he is tender along the entire upper abdomen, the RUQ and mid epigastric area may be a bit more tender than the LUQ, hard to tell.  He reports losing 140 lbs with gastric bypass 4-5 years ago, he says he has maintained that weight loss.   Work-up: He was afebrile and borderline hypotensive.  Labs were essentially unremarkable except for glucose of 141.  Lipase is normal at 32, AST 20, ALT 25, total bilirubin 0.1.  WBC 11.6, H/H13.3/40.3, platelets 254,000.  Respiratory panel is negative.  Chest x-ray shows shallow lung inflation with mild cardiomegaly.  CT angiography of the chest abdomen pelvis: Gallbladder was distended with diffuse pericholecystic fat haziness no radiopaque cholelithiasis, no biliary ductal dilatation.  The pancreas was normal with no masses or ductal dilatation.  There are postsurgical changes from a sleeve gastrectomy with a nondistended stomach.   ROS: Review of Systems Constitutional:  Positive for chills. Negative for diaphoresis, fever, malaise/fatigue and weight loss. HENT: Negative.    Eyes: Negative.   Respiratory: Negative.         Sleep apnea, has been off CPAP, but requiring it again now.    Cardiovascular: Negative.   Gastrointestinal:  Positive for abdominal pain, nausea and vomiting. Negative for blood in stool, constipation, diarrhea, heartburn (occasionally, but  not an issue) and melena. Genitourinary: Negative.   Musculoskeletal: Negative.   Skin: Negative.   Neurological: Negative.   Endo/Heme/Allergies: Negative.   Psychiatric/Behavioral: Negative.      History reviewed. No pertinent family history.       Past Medical History:  Diagnosis Date   Arthritis     GERD (gastroesophageal reflux disease)     Lateral meniscus tear right knee   Morbid obesity (HCC)     OSA (obstructive sleep apnea)      Not currently using CPAP           Past Surgical History:  Procedure Laterality Date   KNEE ARTHROSCOPY Right 05/31/2019    Procedure: Right knee arthroscopy with lateral meniscal debridement;  Surgeon: Ollen Gross, MD;  Location: [facility];  Service: Orthopedics;  Laterality: Right;    KNEE ARTHROSCOPY Right 05/08/2020    Procedure: Right knee arthroscopy, lateral meniscal debridement;  Surgeon: Ollen Gross, MD;  Location: [facility];  Service: Orthopedics;  Laterality: Right;    LAPAROSCOPIC GASTRIC BAND REMOVAL WITH LAPAROSCOPIC GASTRIC SLEEVE RESECTION   03/2015   WISDOM TOOTH EXTRACTION   2010      Social History:  reports that he has never smoked. He has never used smokeless tobacco. He reports that he does not drink alcohol and does not use drugs.   Allergies:       Allergies  Allergen Reactions   Hibiclens [Chlorhexidine Gluconate] Hives and Itching   Lac Bovis Shortness Of Breath   Eggs Or Egg-Derived Products Hives   Peanut Oil Itching,  Swelling and Other (See Comments)      Throat and ears itch & swell   Influenza Vaccines Swelling and Other (See Comments)      Swelling of limbs   Morphine And Related Nausea And Vomiting and Other (See Comments)      "Got sick to my stomach and threw up"             Prior to Admission medications  Medication Sig Start Date End Date Taking? Authorizing Provider  albuterol (VENTOLIN HFA) 108 (90 Base) MCG/ACT inhaler Inhale 2 puffs into the lungs every 6 (six) hours as needed  for wheezing or shortness of breath. Patient not taking: Reported on 02/17/2021 07/02/20     Leroy SeaSingh, Prashant K, MD  HYDROcodone-acetaminophen (NORCO/VICODIN) 5-325 MG tablet Take 1 tablet by mouth every 6 (six) hours as needed for moderate pain. Patient not taking: No sig reported 05/08/20 05/08/21   Edmisten, Lyn HollingsheadKristie L, PA  methocarbamol (ROBAXIN) 500 MG tablet Take 1 tablet (500 mg total) by mouth every 6 (six) hours as needed for muscle spasms. Patient not taking: Reported on 02/17/2021 05/08/20     Edmisten, Danford BadKristie L, PA        Blood pressure 135/80, pulse 92, temperature 98.4 F (36.9 C), temperature source Oral, resp. rate (!) 25, height 5\' 9"  (1.753 m), weight (!) 201.4 kg, SpO2 92 %. Physical Exam: General: pleasant, WD, WN white male who is laying in bed in NAD HEENT: head is normocephalic, atraumatic.  Sclera are noninjected.  pupils are equal.  Ears and nose without any masses or lesions.  Mouth is pink and moist Heart: regular, rate, and rhythm.  Normal s1,s2. No obvious murmurs, gallops, or rubs noted.  Palpable radial and pedal pulses bilaterally Lungs: CTAB, no wheezes, rhonchi, or rales noted.  Respiratory effort nonlabored Abd: soft, tender along the RUQ, mid epigastric and somewhat tender LUQ, ND, +BS, no masses, hernias, or organomegaly.  Port sites from prior surgery MS: all 4 extremities are symmetrical with no cyanosis, clubbing, or edema. Skin: warm and dry with no masses, lesions, or rashes Neuro: Cranial nerves 2-12 grossly intact, sensation is normal throughout Psych: A&Ox3 with an appropriate affect.    Lab Results Last 48 Hours        Results for orders placed or performed during the hospital encounter of 02/17/21 (from the past 48 hour(s))  CBC     Status: Abnormal    Collection Time: 02/17/21  3:07 AM  Result Value Ref Range    WBC 11.6 (H) 4.0 - 10.5 K/uL    RBC 4.89 4.22 - 5.81 MIL/uL    Hemoglobin 13.3 13.0 - 17.0 g/dL    HCT 16.140.3 09.639.0 - 04.552.0 %    MCV  82.4 80.0 - 100.0 fL    MCH 27.2 26.0 - 34.0 pg    MCHC 33.0 30.0 - 36.0 g/dL    RDW 40.916.2 (H) 81.111.5 - 15.5 %    Platelets 254 150 - 400 K/uL    nRBC 0.0 0.0 - 0.2 %      Comment: Performed at [facility], [address] [place], [place] [id number]  Comprehensive metabolic panel     Status: Abnormal    Collection Time: 02/17/21  3:07 AM  Result Value Ref Range    Sodium 139 135 - 145 mmol/L    Potassium 3.8 3.5 - 5.1 mmol/L    Chloride 103 98 - 111 mmol/L    CO2 25 22 -  32 mmol/L    Glucose, Bld 141 (H) 70 - 99 mg/dL      Comment: Glucose reference range applies only to samples taken after fasting for at least 8 hours.    BUN 15 6 - 20 mg/dL    Creatinine, Ser 5.80 0.61 - 1.24 mg/dL    Calcium 9.1 8.9 - 99.8 mg/dL    Total Protein 8.0 6.5 - 8.1 g/dL    Albumin 4.2 3.5 - 5.0 g/dL    AST 20 15 - 41 U/L    ALT 25 0 - 44 U/L    Alkaline Phosphatase 97 38 - 126 U/L    Total Bilirubin 0.1 (L) 0.3 - 1.2 mg/dL    GFR, Estimated >33 >82 mL/min      Comment: (NOTE) Calculated using the CKD-EPI Creatinine Equation (2021)      Anion gap 11 5 - 15      Comment: Performed at [facility], [address] [place], [place] [address]  Lipase, blood     Status: None    Collection Time: 02/17/21  3:07 AM  Result Value Ref Range    Lipase 32 11 - 51 U/L      Comment: Performed at [facility], [address] [place], [place] [address]  Troponin I (High Sensitivity)     Status: None    Collection Time: 02/17/21  3:07 AM  Result Value Ref Range    Troponin I (High Sensitivity) 4 <18 ng/L      Comment: (NOTE) Elevated high sensitivity troponin I (hsTnI) values and significant changes across serial measurements may suggest ACS but many other chronic and acute conditions are known to elevate hsTnI results. Refer to the "Links" section for chest pain algorithms and additional guidance. Performed at [facility], [address] [place], [place]  [address]    Troponin I (High Sensitivity)     Status: None    Collection Time: 02/17/21  5:10 AM  Result Value Ref Range    Troponin I (High Sensitivity) 3 <18 ng/L      Comment: (NOTE) Elevated high sensitivity troponin I (hsTnI) values and significant changes across serial measurements may suggest ACS but many other chronic and acute conditions are known to elevate hsTnI results. Refer to the "Links" section for chest pain algorithms and additional guidance. Performed at [facility], [address] [place], [place] [address]    Resp Panel by RT-PCR (Flu A&B, Covid) Nasopharyngeal Swab     Status: None    Collection Time: 02/17/21  5:16 AM    Specimen: Nasopharyngeal Swab; Nasopharyngeal(NP) swabs in vial transport medium  Result Value Ref Range    SARS Coronavirus 2 by RT PCR NEGATIVE NEGATIVE      Comment: (NOTE) SARS-CoV-2 target nucleic acids are NOT DETECTED.   The SARS-CoV-2 RNA is generally detectable in upper respiratory specimens during the acute phase of infection. The lowest concentration of SARS-CoV-2 viral copies this assay can detect is 138 copies/mL. A negative result does not preclude SARS-Cov-2 infection and should not be used as the sole basis for treatment or other patient management decisions. A negative result may occur with improper specimen collection/handling, submission of specimen other than nasopharyngeal swab, presence of viral mutation(s) within the areas targeted by this assay, and inadequate number of viral copies(<138 copies/mL). A negative result must be combined with clinical observations, patient history, and epidemiological information. The expected result is Negative.  Fact Sheet for Patients: BloggerCourse.com   Fact Sheet for Healthcare Providers: SeriousBroker.it   This test is no t yet approved or cleared by the [place] FDA and has been authorized for detection and/or  diagnosis of SARS-CoV-2 by FDA under an Emergency Use Authorization (EUA). This EUA will remain in effect (meaning this test can be used) for the duration of the COVID-19 declaration under Section 564(b)(1) of the Act, 21 U.S.C.section 360bbb-3(b)(1), unless the authorization is terminated or revoked sooner.          Influenza A by PCR NEGATIVE NEGATIVE    Influenza B by PCR NEGATIVE NEGATIVE      Comment: (NOTE) The Xpert Xpress SARS-CoV-2/FLU/RSV plus assay is intended as an aid in the diagnosis of influenza from Nasopharyngeal swab specimens and should not be used as a sole basis for treatment. Nasal washings and aspirates are unacceptable for Xpert Xpress SARS-CoV-2/FLU/RSV testing.   Fact Sheet for Patients: BloggerCourse.com   Fact Sheet for Healthcare Providers: SeriousBroker.it   This test is not yet approved or cleared by the [place] FDA and has been authorized for detection and/or diagnosis of SARS-CoV-2 by FDA under an Emergency Use Authorization (EUA). This EUA will remain in effect (meaning this test can be used) for the duration of the COVID-19 declaration under Section 564(b)(1) of the Act, 21 U.S.C. section 360bbb-3(b)(1), unless the authorization is terminated or revoked.   Performed at [facility], [address] [place], [place] [address]         Imaging Results (Last 48 hours)  DG Chest [facility] 1 View   Result Date: 02/17/2021 CLINICAL DATA:  Chest pain EXAM: PORTABLE CHEST 1 VIEW COMPARISON:  06/28/2020 FINDINGS: Shallow lung inflation and mild cardiomegaly. No pleural effusion. No focal airspace consolidation. No overt pulmonary edema. IMPRESSION: Shallow lung inflation and mild cardiomegaly. Electronically Signed   By: Deatra Robinson M.D.   On: 02/17/2021 03:42   CT Angio Chest/Abd/Pel for Dissection W and/or Wo Contrast   Result Date: 02/17/2021 CLINICAL DATA:  Abdominal pain. EXAM: CT  ANGIOGRAPHY CHEST, ABDOMEN AND PELVIS TECHNIQUE: Non-contrast CT of the chest was initially obtained. Multidetector CT imaging through the chest, abdomen and pelvis was performed using the standard protocol during bolus administration of intravenous contrast. Multiplanar reconstructed images and MIPs were obtained and reviewed to evaluate the vascular anatomy. CONTRAST:  OMNIPAQUE IOHEXOL 350 MG/ML SOLN COMPARISON:  05/10/2020 chest CT angiogram. FINDINGS: CTA CHEST FINDINGS Cardiovascular: Top-normal heart size. No significant pericardial effusion/thickening. Great vessels are normal in course and caliber. No evidence of acute intramural hematoma, dissection, pseudoaneurysm or penetrating atherosclerotic ulcer in the thoracic aorta. No central pulmonary emboli. Mediastinum/Nodes: No discrete thyroid nodules. Unremarkable esophagus. No pathologically enlarged axillary, mediastinal or hilar lymph nodes. Lungs/Pleura: No pneumothorax. No pleural effusion. No acute consolidative airspace disease, lung masses or significant pulmonary nodules. Musculoskeletal: No aggressive appearing focal osseous lesions. Mild thoracic spondylosis. Review of the MIP images confirms the above findings. CTA ABDOMEN AND PELVIS FINDINGS VASCULAR Aorta: Normal caliber aorta without aneurysm, dissection, vasculitis or significant stenosis. Celiac: Patent without evidence of aneurysm, dissection, vasculitis or significant stenosis. SMA: Patent without evidence of aneurysm, dissection, vasculitis or significant stenosis. Renals: Bilateral renal arteries are patent without evidence of aneurysm, dissection, vasculitis, fibromuscular dysplasia or significant stenosis. IMA: Patent without evidence of aneurysm, dissection, vasculitis or significant stenosis. Inflow: Patent without evidence of aneurysm, dissection, vasculitis or significant stenosis. Veins: No obvious venous abnormality within the limitations of this  arterial phase study.  Review of the MIP images confirms the above findings. NON-VASCULAR Hepatobiliary: Normal liver with no liver mass. Distended gallbladder (5.4 cm diameter). Diffuse pericholecystic fat haziness. No radiopaque cholelithiasis. No biliary ductal dilatation. Pancreas: Normal, with no mass or duct dilation. Spleen: Normal size. No mass. Adrenals/Urinary Tract: Normal adrenals. Normal kidneys with no hydronephrosis and no renal mass. Normal bladder. Stomach/Bowel: Expected postsurgical changes from sleeve gastrectomy with nondistended stomach. Haziness of the peri-duodenal fat. Normal caliber small bowel with no small bowel wall thickening. Normal appendix. Normal large bowel with no diverticulosis, large bowel wall thickening or pericolonic fat stranding. Vascular/Lymphatic: Mildly enlarged 1.0 cm right mesenteric node (series 8/image 209). No additional pathologically enlarged lymph nodes in the abdomen or pelvis. Reproductive: Normal size prostate. Other: No pneumoperitoneum, ascites or focal fluid collection. Musculoskeletal: No aggressive appearing focal osseous lesions. Review of the MIP images confirms the above findings. IMPRESSION: 1. No acute aortic syndrome. 2. Distended gallbladder with diffuse pericholecystic fat haziness. No radiopaque cholelithiasis. No biliary ductal dilatation. Findings raise concern for acute cholecystitis. Consider surgical consultation and correlation with right upper quadrant abdominal ultrasound. 3. Mild right mesenteric lymphadenopathy, nonspecific. Consider follow-up CT abdomen/pelvis with oral and IV contrast in 3 months. This recommendation follows ACR consensus guidelines: White Paper of the ACR Incidental Findings Committee II on Splenic and Nodal Findings. J Am Coll Radiol [phone]. Electronically Signed   By: Delbert Phenix M.D.   On: 02/17/2021 08:40           Assessment/Plan   Abdominal pain, nausea vomiting Possible acute cholecystitis per CT Normal LFTs.    FEN: Currently n.p.o. he had some juice at 8:30 AM. ID: None currently DVT: None   Morbid obesity BMI 65.57 OSA -not currently using CPAP Hx GERD   Plan: We have made him n.p.o., the ultrasound shows large stones and pericholecystic fluid.  He has been evaluated by Dr. Dossie Der  who will plan to take him to the OR later this AM.   Sherrie George, [facility] 02/17/2021, 9:13 AM Please see Amion for pager number during day hours 7:00am-4:30pm             Note Details  Author Matilde Sprang File Time 02/17/2021 10:02 AM  Author Type Physician Assistant Status Incomplete  Last Editor Sherrie George, [facility] Surgery  Hospital Acct # [id number] Admit Date 02/17/2021

## 2021-02-17 NOTE — ED Notes (Signed)
CT tech informed this RN that CT scanner is not working currently; she is going to [facility], operational

## 2021-02-17 NOTE — ED Notes (Signed)
Pt had been doing well off [place], oxygen remained above 94% on RA

## 2021-02-17 NOTE — ED Notes (Signed)
Per pt request, this RN contacted fiance Marchelle Folks and informed her of his transfer; pt reports he will call her "when he gets over there"

## 2021-02-17 NOTE — ED Triage Notes (Signed)
Pt brought in by [facility]. Pt reports pain is a 10 out of 10.

## 2021-02-17 NOTE — Anesthesia Postprocedure Evaluation (Signed)
Anesthesia Post Note  Patient: Antonio Conner  Procedure(s) Performed: LAPAROSCOPIC CHOLECYSTECTOMY     Patient location during evaluation: PACU Anesthesia Type: General Level of consciousness: awake and alert Pain management: pain level controlled Vital Signs Assessment: post-procedure vital signs reviewed and stable Respiratory status: spontaneous breathing, nonlabored ventilation, respiratory function stable and patient connected to nasal cannula oxygen Cardiovascular status: blood pressure returned to baseline and stable Postop Assessment: no apparent nausea or vomiting Anesthetic complications: no   No notable events documented.  Last Vitals:  Vitals:   02/17/21 1359 02/17/21 1414  BP: 116/77 124/78  Pulse: (!) 105 (!) 102  Resp: 19 (!) 23  Temp:    SpO2: 94% 94%    Last Pain:  Vitals:   02/17/21 1344  TempSrc:   PainSc: 0-No pain                 Kennieth Rad

## 2021-02-17 NOTE — ED Notes (Addendum)
Pt alert, NAD calm, sitting upright in stretcher, mentions pain is worse. Also severe nausea. Moved from h/w to room. VSS. NSR HR 85.

## 2021-02-17 NOTE — Transfer of Care (Signed)
Immediate Anesthesia Transfer of Care Note  Patient: Antonio Conner  Procedure(s) Performed: LAPAROSCOPIC CHOLECYSTECTOMY  Patient Location: PACU  Anesthesia Type:General  Level of Consciousness: awake, alert  and oriented  Airway & Oxygen Therapy: Patient Spontanous Breathing and Patient connected to face mask oxygen  Post-op Assessment: Report given to RN and Post -op Vital signs reviewed and stable  Post vital signs: Reviewed and stable  Last Vitals:  Vitals Value Taken Time  BP 124/75   Temp    Pulse 115   Resp    SpO2 95     Last Pain:  Vitals:   02/17/21 1107  TempSrc: Oral  PainSc: 10-Worst pain ever      Patients Stated Pain Goal: 5 (02/17/21 1107)  Complications: No notable events documented.

## 2021-02-17 NOTE — ED Notes (Signed)
EDP at bedside  

## 2021-02-17 NOTE — ED Notes (Signed)
VSS. Placed on 2L [place] after fentanyl given, to CT.

## 2021-02-17 NOTE — Anesthesia Procedure Notes (Signed)
Procedure Name: Intubation Date/Time: 02/17/2021 11:44 AM Performed by: Dorthea Cove, CRNA Pre-anesthesia Checklist: Patient identified, Emergency Drugs available, Suction available and Patient being monitored Patient Re-evaluated:Patient Re-evaluated prior to induction Oxygen Delivery Method: Circle system utilized Preoxygenation: Pre-oxygenation with 100% oxygen Induction Type: IV induction, Rapid sequence and Cricoid Pressure applied Laryngoscope Size: Mac, Glidescope and 4 Grade View: Grade I Tube type: Oral Tube size: 8.0 mm Number of attempts: 1 Airway Equipment and Method: Stylet, Oral airway and Video-laryngoscopy Placement Confirmation: ETT inserted through vocal cords under direct vision, positive ETCO2 and breath sounds checked- equal and bilateral Secured at: 24 cm Tube secured with: Tape Dental Injury: Teeth and Oropharynx as per pre-operative assessment  Difficulty Due To: Difficulty was anticipated, Difficult Airway- due to large tongue, Difficult Airway- due to reduced neck mobility and Difficult Airway- due to limited oral opening Future Recommendations: Recommend- induction with short-acting agent, and alternative techniques readily available

## 2021-02-17 NOTE — Anesthesia Preprocedure Evaluation (Signed)
Anesthesia Evaluation  Patient identified by MRN, date of birth, ID band Patient awake    Reviewed: Allergy & Precautions, NPO status , Patient's Chart, lab work & pertinent test results  Airway Mallampati: III  TM Distance: >3 FB     Dental  (+) Dental Advisory Given   Pulmonary sleep apnea ,    breath sounds clear to auscultation       Cardiovascular negative cardio ROS   Rhythm:Regular Rate:Normal     Neuro/Psych negative neurological ROS     GI/Hepatic Neg liver ROS, GERD  ,  Endo/Other  Morbid obesity  Renal/GU negative Renal ROS     Musculoskeletal  (+) Arthritis ,   Abdominal   Peds  Hematology negative hematology ROS (+)   Anesthesia Other Findings   Reproductive/Obstetrics                             Anesthesia Physical Anesthesia Plan  ASA: 3  Anesthesia Plan: General   Post-op Pain Management:    Induction: Intravenous  PONV Risk Score and Plan: 2 and Dexamethasone, Ondansetron and Treatment may vary due to age or medical condition  Airway Management Planned: Oral ETT  Additional Equipment: None  Intra-op Plan:   Post-operative Plan: Extubation in OR  Informed Consent: I have reviewed the patients History and Physical, chart, labs and discussed the procedure including the risks, benefits and alternatives for the proposed anesthesia with the patient or authorized representative who has indicated his/her understanding and acceptance.     Dental advisory given  Plan Discussed with: CRNA  Anesthesia Plan Comments:         Anesthesia Quick Evaluation

## 2021-02-17 NOTE — ED Provider Notes (Addendum)
Patient here for CT scan.  Transferred from med [facility].  Scanner at that facility not functioning. Physical Exam  BP (!) 144/97 (BP Location: Right Leg)   Pulse 78   Temp 98.4 F (36.9 C) (Oral)   Resp 20   Ht 5\' 9"  (1.753 m)   Wt (!) 201.4 kg   SpO2 97%   BMI 65.57 kg/m   Physical Exam Constitutional:      Comments: Patient alert.  Nontoxic.  Morbid obesity.  He appears to be in significant pain.  HENT:     Mouth/Throat:     Pharynx: Oropharynx is clear.  Cardiovascular:     Comments: Borderline tachycardia Pulmonary:     Comments: No respiratory distress grossly clear. Abdominal:     Comments: Severe obesity of the abdomen.  Lower abdomen nontender.  Patient endorses severe pain to palpation around the epigastrium and mid right upper quadrant.  Skin:    General: Skin is warm and dry.  Neurological:     General: No focal deficit present.     Mental Status: He is oriented to person, place, and time.     Coordination: Coordination normal.  Psychiatric:        Mood and Affect: Mood normal.    ED Course/Procedures     Procedures  MDM   Patient assessed after completion of CT scan.  He reports pain medication is helping for about an hour and as soon as it wears off he has severe pain again.  He reports pain is centering in his epigastrium and slightly to the right.  He reports he does feel he is getting chills.  He reports he does not have any significant medical problems and was healthy before he started having pain and vomiting in his abdomen for which she came to the emergency department.  CT shows findings consistent with cholecystitis.  Patient has mild leukocytosis.  He is very symptomatic.  We will start fluid resuscitation, pain control, Zosyn and surgery consult.  Unfortunately, patient's n.p.o. status apparently had not been implemented.  A meal tray had been delivered to the patient's ED room.  Patient drank fluids from the tray but did not eat  anything.  Consult: Reviewed with Will general surgery.  Agrees with proceeding with fluids and antibiotics.  Request admission to medical service      Marlyne Beards, MD 02/17/21 02/19/21    0350, MD 02/17/21 [phone]

## 2021-02-17 NOTE — ED Triage Notes (Signed)
Pt c/o epigastric pain starting around midnight. Pt reports no relief after pepto and tylenol.

## 2021-02-17 NOTE — ED Notes (Signed)
Surgeon by to see pt. Pt in [place]. Labs delayed.

## 2021-02-17 NOTE — Op Note (Signed)
Patient: Antonio Conner (1986-05-13, [id number])  Date of Surgery: 02/17/2021   Preoperative Diagnosis: Cholecystitis   Postoperative Diagnosis: Gangrenous Cholecystitis   Surgical Procedure: LAPAROSCOPIC CHOLECYSTECTOMY:    Operative Team Members:  Surgeon(s) and Role:    * Shelley Pooley, Hyman Hopes, MD - Primary   Anesthesiologist: Marcene Duos, MD CRNA: Lelon Perla, CRNA   Anesthesia: General   Fluids:  Total I/O In: 1650 [I.V.:1600; IV Piggyback:50] Out: 150 [Blood:150]  Complications: none  Drains:  (19 fr) Jackson-Pratt drain(s) with closed bulb suction in the RUQ gallbladder fossa    Specimen:  ID Type Source Tests Collected by Time Destination  1 : Gallbladder Tissue PATH Gallbladder SURGICAL PATHOLOGY Annarae Macnair, Hyman Hopes, MD 02/17/2021 1216      Disposition:  PACU - hemodynamically stable.  Plan of Care: Admit for overnight observation    Indications for Procedure: Antonio Conner is a 35 y.o. male who presented with abdominal pain.  History, physical and imaging was concerning for cholecystitis.  Laparoscopic cholecystectomy was recommended for the patient.  The procedure itself, as well as the risks, benefits and alternatives were discussed with the patient.  Risks discussed included but were not limited to the risk of infection, bleeding, damage to nearby structures, need to convert to open procedure, incisional hernia, bile leak, common bile duct injury and the need for additional procedures or surgeries.  With this discussion complete and all questions answered the patient granted consent to proceed.  Findings: Gangrenous cholecystitis  Infection status: Patient: Antonio Conner [facility] Service Patient Case: Emergent Infection Present At Time Of Surgery (PATOS):  gangrenous gallbladder with contamination of the field with bile   Description of Procedure:   On the date stated above, the patient was taken to the operating room suite  and placed in supine positioning.  Sequential compression devices were placed on the lower extremities to prevent blood clots.  General endotracheal anesthesia was induced. Preoperative antibiotics (cefazolin) were given within 30 minutes of incision.  The patient's abdomen was prepped and draped in the usual sterile fashion.  A time-out was completed verifying the correct patient, procedure, positioning and equipment needed for the case.  We began by anesthetizing the skin with local anesthetic and then making a 5 mm incision just below the umbilicus.  We dissected through the subcutaneous tissues to the fascia.  The fascia was grasped and elevated using a Kocher clamp.  A Veress needle was inserted into the abdomen and the abdomen was insufflated to 15 mmHg.  A 5 mm trocar was inserted in this position under optical guidance and then the abdomen was inspected.  There was no trauma to the underlying viscera with initial trocar placement.  Any abnormal findings, other than inflammation in the right upper quadrant, are listed above in the findings section.  Three additional trocars were placed, one 12 mm trocar in the subxiphoid position, one 5 mm trocar in the midline epigastric area and one 51mm trocar in the right upper quadrant subcostally.  These were placed under direct vision without any trauma to the underlying viscera.    The patient was then placed in head up, left side down positioning.  The gallbladder was identified and dissected free from its attachments to the omentum allowing the duodenum to fall away.  The infundibulum of the gallbladder was dissected free working laterally to medially.  The cystic duct and cystic artery were dissected free from surrounding connective tissue.  The infundibulum of the gallbladder was dissected off the cystic  plate.  Attempts were made to create a critical view of safety, however due to the inflammation, patient body habitus and short cystic duct we were unable to  obtain a critical view.  We were careful to divide the duct as it clearly entered the gallbladder and the artery up on the wall of the gallbladder.  Clips were then applied to the cystic duct and cystic artery and then these structures were divided.  The gallbladder was dissected off the cystic plate, placed in an endocatch bag and removed from the 12 mm subxiphoid port site.  The port site had to be enlarged to remove the large gallstones.  The clips were inspected and appeared effective.  The cystic plate was inspected and hemostasis was obtained using electrocautery.  Topical hemostatic snow was placed in the gallbladder fossa due to difficulty with hemostasis due to severe inflammation.  A suction irrigator was used to clean the operative field.  A 19 french drain was brought through the RUQ port site and left in the gallbladder fossa.  Attention was turned to closure.  The 12 mm subxiphoid port site was closed using a 0-vicryl suture on a fascial suture passer with a figure of eight suture.  The abdomen was desufflated.  The skin was closed using 4-0 monocryl and dermabond.  All sponge and needle counts were correct at the conclusion of the case.    Ivar Drape, MD General, Bariatric, & Minimally Invasive Surgery [facility], [place]

## 2021-02-18 ENCOUNTER — Encounter (HOSPITAL_COMMUNITY): Payer: Self-pay | Admitting: Surgery

## 2021-02-18 MED ORDER — DOCUSATE SODIUM 100 MG PO CAPS: 100.0000 mg | ORAL_CAPSULE | Freq: Two times a day (BID) | ORAL | 0 refills | Status: AC

## 2021-02-18 MED ORDER — TRAMADOL HCL 50 MG PO TABS: 50.0000 mg | ORAL_TABLET | Freq: Four times a day (QID) | ORAL | 0 refills | Status: AC | PRN

## 2021-02-18 NOTE — Progress Notes (Signed)
Antonio Conner to be D/C'd  per MD order.  Discussed with the patient and all questions fully answered.  VSS, Skin clean, dry and intact without evidence of skin break down, no evidence of skin tears noted. JP drain put to bulb suction and emptied, dressing clean dry and intact. Patient educated with teach back on emptying and changing dressing. D/c med details provided and all questions answered regarding d/c.   IV catheter discontinued intact. Site without signs and symptoms of complications. Dressing and pressure applied.  An After Visit Summary was printed and given to the patient.   Patient instructed to return to ED, call 911, or call MD for any changes in condition.   Patient to be escorted via WC, and D/C home via private auto.

## 2021-02-18 NOTE — Discharge Instructions (Signed)
[facility] ______[facility] LAPAROSCOPIC SURGERY: POST OP INSTRUCTIONS Always review your discharge instruction sheet given to you by the facility where your surgery was performed. IF YOU HAVE DISABILITY OR FAMILY LEAVE FORMS, YOU MUST BRING THEM TO THE OFFICE FOR PROCESSING.   DO NOT GIVE THEM TO YOUR DOCTOR.  A prescription for pain medication may be given to you upon discharge.  Take your pain medication as prescribed, if needed.  If narcotic pain medicine is not needed, then you may take acetaminophen (Tylenol) or ibuprofen (Advil) as needed. Take your usually prescribed medications unless otherwise directed. If you need a refill on your pain medication, please contact your pharmacy.  They will contact our office to request authorization. Prescriptions will not be filled after 5pm or on week-ends. You should follow a light diet the first few days after arrival home, such as soup and crackers, etc.  Be sure to include lots of fluids daily. Most patients will experience some swelling and bruising in the area of the incisions.  Ice packs will help.  Swelling and bruising can take several days to resolve.  It is common to experience some constipation if taking pain medication after surgery.  Increasing fluid intake and taking a stool softener (such as Colace) will usually help or prevent this problem from occurring.  A mild laxative (Milk of Magnesia or Miralax) should be taken according to package instructions if there are no bowel movements after 48 hours. Unless discharge instructions indicate otherwise, you may remove your bandages 24-48 hours after surgery, and you may shower at that time.  You may have steri-strips (small skin tapes) in place directly over the incision.  These strips should be left on the skin for 7-10 days.  If your surgeon used skin glue on the incision, you may shower in 24 hours.  The glue will flake off over the next 2-3 weeks.  Any sutures or staples will be  removed at the office during your follow-up visit. ACTIVITIES:  You may resume regular (light) daily activities beginning the next day--such as daily self-care, walking, climbing stairs--gradually increasing activities as tolerated.  You may have sexual intercourse when it is comfortable.  Refrain from any heavy lifting or straining until approved by your doctor. You may drive when you are no longer taking prescription pain medication, you can comfortably wear a seatbelt, and you can safely maneuver your car and apply brakes. RETURN TO WORK:  __________________________________________________________ You should see your doctor in the office for a follow-up appointment approximately 2-3 weeks after your surgery.  Make sure that you call for this appointment within a day or two after you arrive home to insure a convenient appointment time. OTHER INSTRUCTIONS: __________________________________________________________________________________________________________________________ __________________________________________________________________________________________________________________________ WHEN TO CALL YOUR DOCTOR: Fever over 101.0 Inability to urinate Continued bleeding from incision. Increased pain, redness, or drainage from the incision. Increasing abdominal pain  The clinic staff is available to answer your questions during regular business hours.  Please don't hesitate to call and ask to speak to one of the nurses for clinical concerns.  If you have a medical emergency, go to the nearest emergency room or call 911.  A surgeon from [facility] is always on call at the hospital. [address], [address], [place], [place]  [address] ? [address] [address], [place], [place]   [address] [phone] ? [phone] ? FAX [phone] Web site: [email or web address]  

## 2021-02-18 NOTE — Progress Notes (Signed)
Pt ambulated in the hallway x2

## 2021-02-18 NOTE — Discharge Summary (Signed)
Physician Discharge Summary  Patient ID: Antonio Conner MRN: [id number] DOB/AGE: [birth year] 35 y.o.  Admit date: 02/17/2021 Discharge date: 02/18/2021  Admission Diagnoses: Acute calculus cholecystitis  Discharge Diagnoses:  Active Problems:   Cholecystitis, acute with cholelithiasis   Discharged Condition: good  Hospital Course: He was admitted with acute calculus cholecystitis and underwent laparoscopic cholecystectomy.  He was observed overnight following surgery during which time he was able to tolerate a diet, mobilize independently, pain was well controlled.  He was deemed stable for discharge on postop day 1.    Discharge Exam: Blood pressure (!) 141/70, pulse 85, temperature (!) 97.5 F (36.4 C), temperature source Oral, resp. rate 17, height 5\' 9"  (1.753 m), weight (!) 201.4 kg, SpO2 98 %. He is alert, well-appearing Unlabored respirations Abdomen is soft, nontender, incisions clean dry intact, JP drain with sanguinous serous output.  Disposition: Discharge disposition: 01-Home or Self Care       Discharge Instructions     Discharge patient   Complete by: As directed    Discharge disposition: 01-Home or Self Care   Discharge patient date: 02/18/2021      Allergies as of 02/18/2021       Reactions   Hibiclens [chlorhexidine Gluconate] Hives, Itching   Lac Bovis Shortness Of Breath   Eggs Or Egg-derived Products Hives   Peanut Oil Itching, Swelling, Other (See Comments)   Throat and ears itch & swell   Influenza Vaccines Swelling, Other (See Comments)   Swelling of limbs   Morphine And Related Nausea And Vomiting, Other (See Comments)   "Got sick to my stomach and threw up"        Medication List     STOP taking these medications    albuterol 108 (90 Base) MCG/ACT inhaler Commonly known as: VENTOLIN HFA   HYDROcodone-acetaminophen 5-325 MG tablet Commonly known as: NORCO/VICODIN   methocarbamol 500 MG tablet Commonly known as: Robaxin        TAKE these medications    docusate sodium 100 MG capsule Commonly known as: Colace Take 1 capsule (100 mg total) by mouth 2 (two) times daily. Okay to decrease to once daily or stop taking if having loose bowel movements   traMADol 50 MG tablet Commonly known as: Ultram Take 1 tablet (50 mg total) by mouth every 6 (six) hours as needed for up to 3 days.        Follow-up Information     [facility] 02/20/2021 Follow up in 10 day(s).   Specialty: General Surgery Why: Follow up around 02/27/21 for drain removal and wound check Contact information: [address] [place] [phone]                 Signed: [phone] 02/18/2021, 9:26 AM

## 2021-02-20 LAB — SURGICAL PATHOLOGY

## 2021-02-24 ENCOUNTER — Emergency Department (HOSPITAL_BASED_OUTPATIENT_CLINIC_OR_DEPARTMENT_OTHER)
Admission: EM | Admit: 2021-02-24 | Discharge: 2021-02-24 | Disposition: A | Discharge status: Home / Self Care | Payer: Self-pay | Attending: Emergency Medicine | Admitting: Emergency Medicine

## 2021-02-24 ENCOUNTER — Encounter (HOSPITAL_BASED_OUTPATIENT_CLINIC_OR_DEPARTMENT_OTHER): Payer: Self-pay | Admitting: Emergency Medicine

## 2021-02-24 ENCOUNTER — Other Ambulatory Visit: Payer: Self-pay

## 2021-02-24 DIAGNOSIS — Z91010 Allergy to peanuts: Secondary | ICD-10-CM | POA: Insufficient documentation

## 2021-02-24 DIAGNOSIS — K9189 Other postprocedural complications and disorders of digestive system: Secondary | ICD-10-CM | POA: Insufficient documentation

## 2021-02-24 DIAGNOSIS — Z8616 Personal history of COVID-19: Secondary | ICD-10-CM | POA: Insufficient documentation

## 2021-02-24 DIAGNOSIS — K219 Gastro-esophageal reflux disease without esophagitis: Secondary | ICD-10-CM | POA: Insufficient documentation

## 2021-02-24 DIAGNOSIS — Z5189 Encounter for other specified aftercare: Secondary | ICD-10-CM

## 2021-02-24 NOTE — Discharge Instructions (Addendum)
Your drain is doing it's Job!Marland Kitchen Some crusting is normal. If you have a sudden increase in your pain, fever, or copious, foul smelling discharge from your drain please return to the ER.

## 2021-02-24 NOTE — ED Provider Notes (Signed)
[facility] EMERGENCY DEPARTMENT Provider Note   CSN: [id number] Arrival date & time: 02/24/21  1354     History Chief Complaint  Patient presents with  . Post-op Problem    Antonio Conner is a 35 y.o. male who is here for evaluation of percutaneous drain after rupture of his gallbladder.  This occurred on 02/17/2021.  Patient was concerned because he is noted some crusting around the drain site.  He has had good output into his JP drain, no increase in his pain, no fevers or chills, no obvious purulent discharge.  HPI     Past Medical History:  Diagnosis Date  . Arthritis   . GERD (gastroesophageal reflux disease)   . Lateral meniscus tear right knee  . Morbid obesity (HCC)   . OSA (obstructive sleep apnea)    Not currently using CPAP    Patient Active Problem List   Diagnosis Date Noted  . Cholecystitis, acute with cholelithiasis 02/17/2021  . Pneumonia due to COVID-19 virus 06/28/2020  . Lateral meniscal tear 05/06/2020    Past Surgical History:  Procedure Laterality Date  . CHOLECYSTECTOMY N/A 02/17/2021   Procedure: LAPAROSCOPIC CHOLECYSTECTOMY;  Surgeon: Quentin Ore, MD;  Location: [facility] OR;  Service: General;  Laterality: N/A;  . KNEE ARTHROSCOPY Right 05/31/2019   Procedure: Right knee arthroscopy with lateral meniscal debridement;  Surgeon: Ollen Gross, MD;  Location: [facility] ORS;  Service: Orthopedics;  Laterality: Right;   . KNEE ARTHROSCOPY Right 05/08/2020   Procedure: Right knee arthroscopy, lateral meniscal debridement;  Surgeon: Ollen Gross, MD;  Location: [facility] ORS;  Service: Orthopedics;  Laterality: Right;   . LAPAROSCOPIC GASTRIC BAND REMOVAL WITH LAPAROSCOPIC GASTRIC SLEEVE RESECTION  03/2015  . WISDOM TOOTH EXTRACTION  2010       No family history on file.  Social History   Tobacco Use  . Smoking status: Never  . Smokeless tobacco: Never  Vaping Use  . Vaping Use: Never used  Substance Use Topics  . Alcohol use:  No  . Drug use: No    Home Medications Prior to Admission medications   Medication Sig Start Date End Date Taking? Authorizing Provider  docusate sodium (COLACE) 100 MG capsule Take 1 capsule (100 mg total) by mouth 2 (two) times daily. Okay to decrease to once daily or stop taking if having loose bowel movements 02/18/21 03/20/21  Berna Bue, MD    Allergies    Hibiclens [chlorhexidine gluconate], Lac bovis, Eggs or egg-derived products, Peanut oil, Influenza vaccines, and Morphine and related  Review of Systems   Review of Systems  Constitutional:  Negative for chills and fever.  Skin:  Positive for wound. Negative for rash.   Physical Exam Updated Vital Signs BP (!) 133/94   Pulse 83   Temp 98.1 F (36.7 C) (Oral)   Resp 20   Ht 5\' 9"  (1.753 m)   Wt (!) 197.8 kg   SpO2 97%   BMI 64.39 kg/m   Physical Exam Constitutional:      Appearance: He is obese.  HENT:     Head: Normocephalic.     Right Ear: External ear normal.     Left Ear: External ear normal.     Nose: Nose normal.  Eyes:     Conjunctiva/sclera: Conjunctivae normal.  Pulmonary:     Effort: Pulmonary effort is normal.  Abdominal:     Comments: Percutaneous drain present in the right upper quadrant of the abdomen.  There is some  scant serous sanguinous crusting around the JP drain.  No obvious purulent discharge.  Minimally tender.  Musculoskeletal:        General: Normal range of motion.  Skin:    General: Skin is warm and dry.  Neurological:     Mental Status: He is alert.  Psychiatric:        Thought Content: Thought content normal.         ED Results / Procedures / Treatments   Labs (all labs ordered are listed, but only abnormal results are displayed) Labs Reviewed - No data to display  EKG None  Radiology No results found.  Procedures Procedures   Medications Ordered in ED Medications - No data to display  ED Course  I have reviewed the triage vital signs and the  nursing notes.  Pertinent labs & imaging results that were available during my care of the patient were reviewed by me and considered in my medical decision making (see chart for details).    MDM Rules/Calculators/A&P                           Patient with for evaluation of postop drain placement.  Drain appears well-healing, no obvious signs of infection.  I placed pictures in the chart and reviewed them with Dr. Sophronia Simas.  We both agree that these appear within normal limits.  He safe for discharge after reapplication of his dressing. Final Clinical Impression(s) / ED Diagnoses Final diagnoses:  None    Rx / DC Orders ED Discharge Orders     None        Arthor Captain, PA-C 02/25/21 0002    Rolan Bucco, MD 02/25/21 1308

## 2021-02-24 NOTE — ED Triage Notes (Signed)
Pt had cholecystectomy 1 wk ago (sts it ruptured); leaking from around jp drain insertion site x 2days; that area is also a little painful, other incision sites are not

## 2021-06-20 ENCOUNTER — Encounter (HOSPITAL_COMMUNITY): Payer: Self-pay | Admitting: Radiology

## 2021-09-22 IMAGING — CT CT ANGIO CHEST
2 of 9 series · 18 of 36 positions shown · IV contrast (Omnipaque)
Comparison: Chest radiograph from earlier today.

CLINICAL DATA: Recent knee surgery. Chest and rib pain. Facial
rash.

EXAM:
CT ANGIOGRAPHY CHEST WITH CONTRAST
TECHNIQUE: Multidetector CT imaging of the chest was performed using the
standard protocol during bolus administration of intravenous
contrast. Multiplanar CT image reconstructions and MIPs were
obtained to evaluate the vascular anatomy.
CONTRAST:  125mL OMNIPAQUE IOHEXOL 350 MG/ML SOLN

[Series 7: pe coronal mpr · coronal · 0.59mm/px · 1 of 119 slices shown]
[im 60/119  mediastinal]
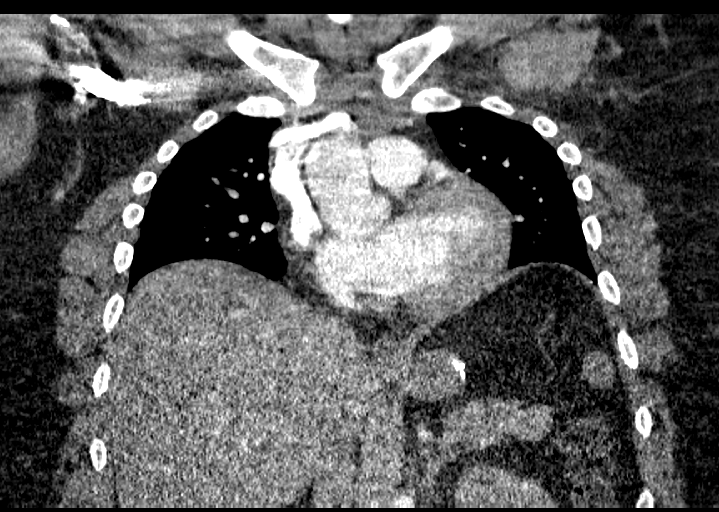

[Series 11: pe thins · axial · 0.89mm/px · z∈[-299,-44]mm · 17 of 379 slices shown]
[im 19/379  lung]
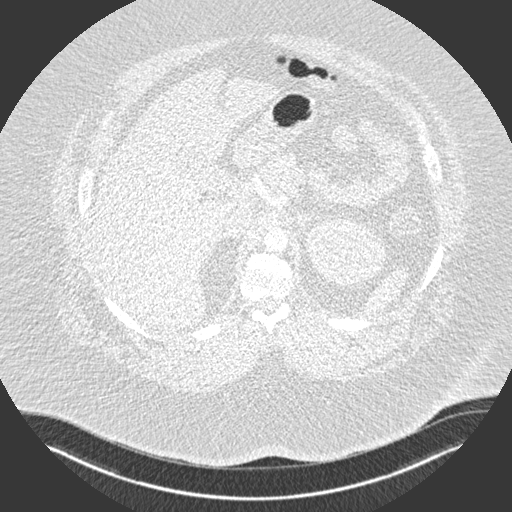
[im 38/379  mediastinal]
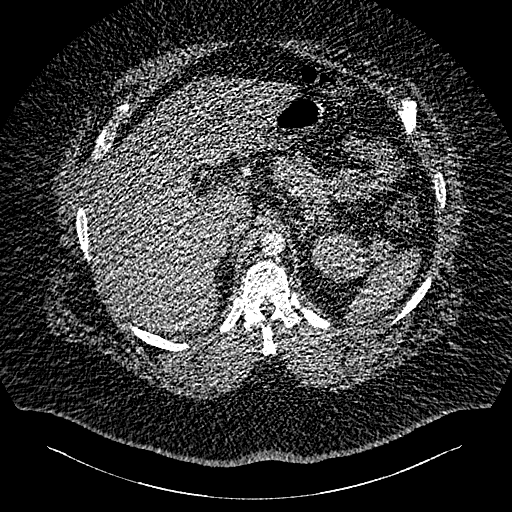
[im 57/379  lung]
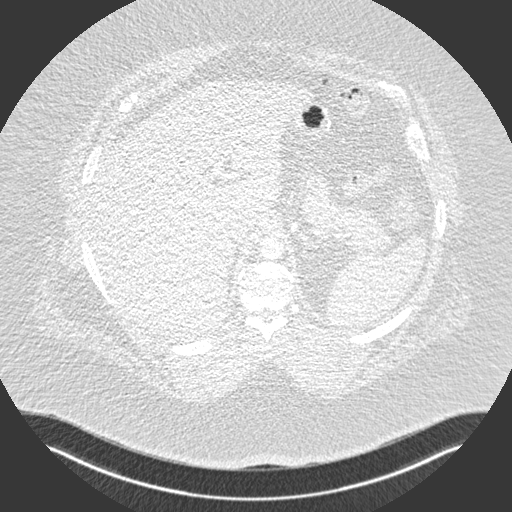
[im 76/379  mediastinal]
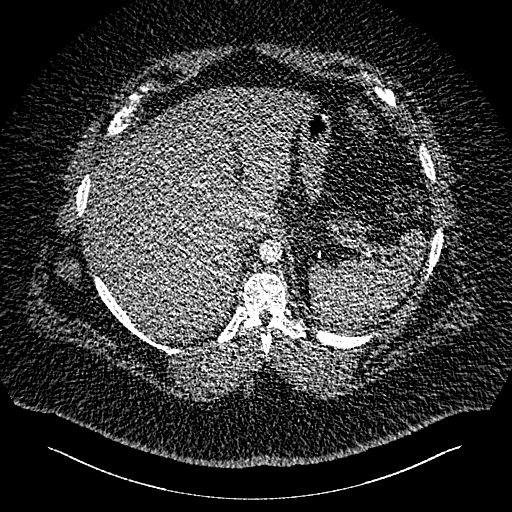
[im 114/379  lung]
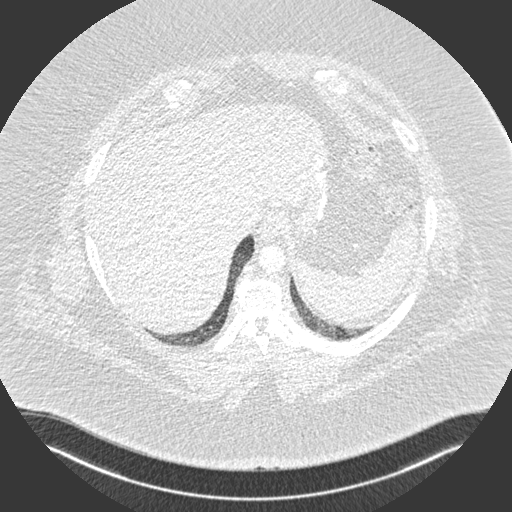
[im 133/379  mediastinal]
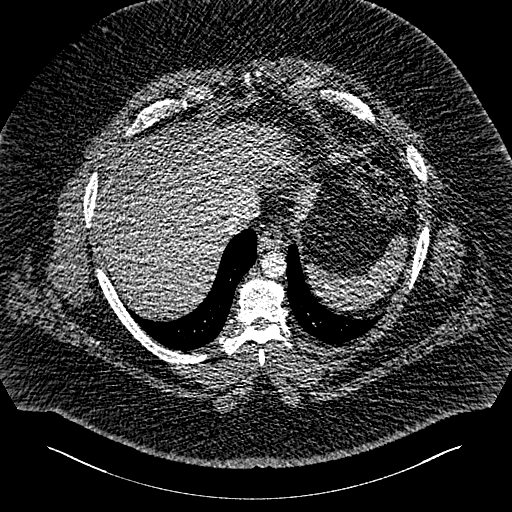
[im 152/379  lung]
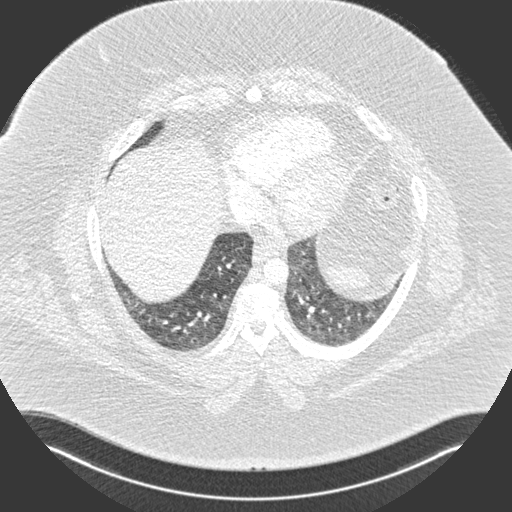
[im 171/379  mediastinal]
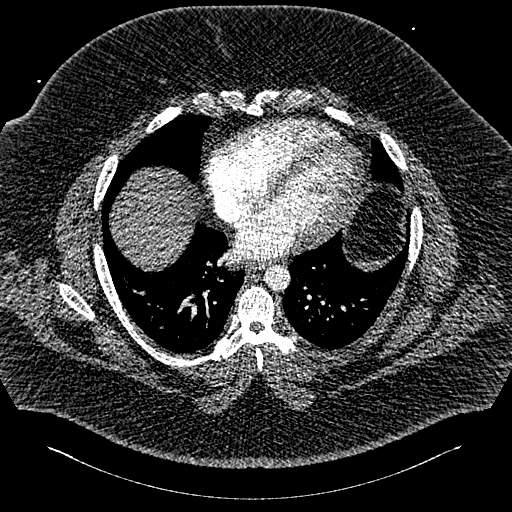
[im 190/379  lung]
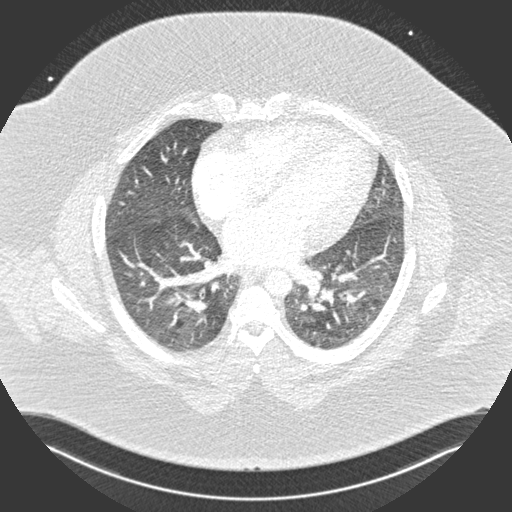
[im 208/379  mediastinal]
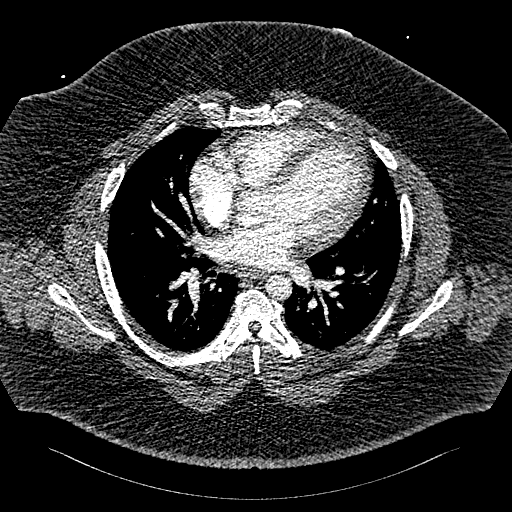
[im 227/379  lung]
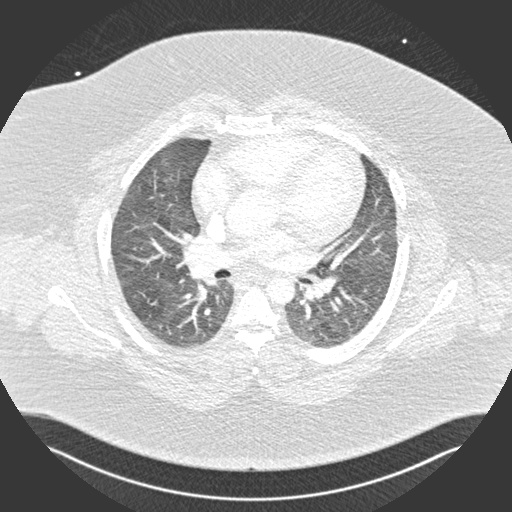
[im 246/379  mediastinal]
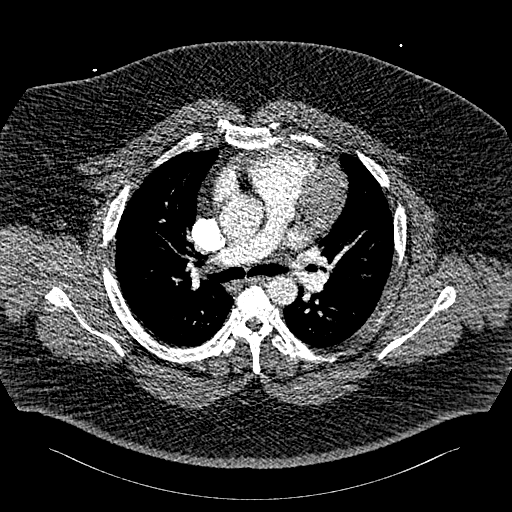
[im 265/379  lung]
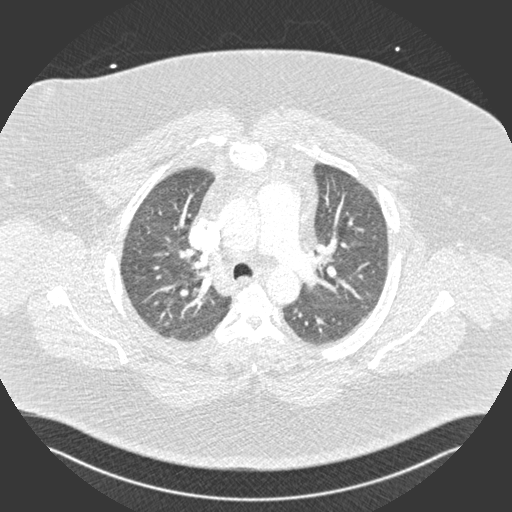
[im 303/379  mediastinal]
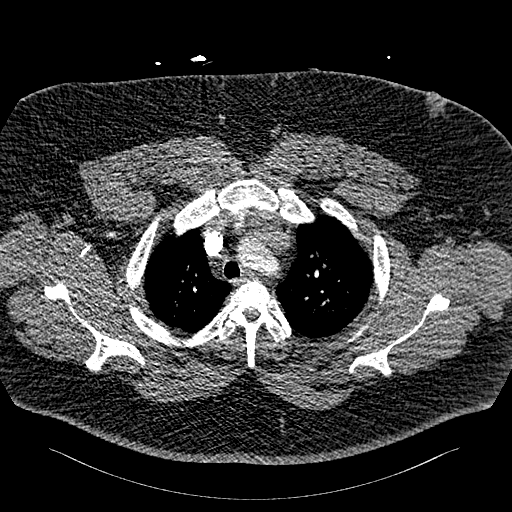
[im 322/379  lung]
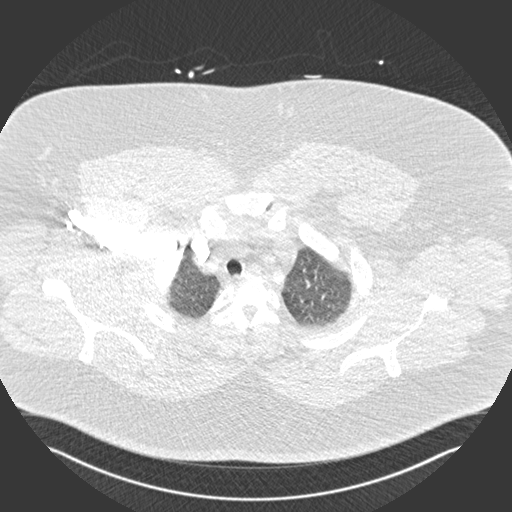
[im 341/379  mediastinal]
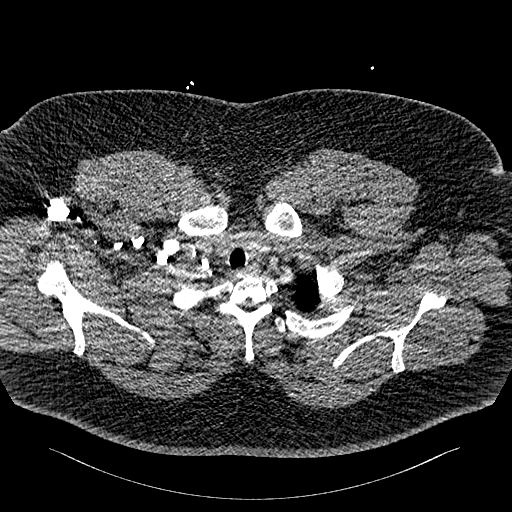
[im 360/379  lung]
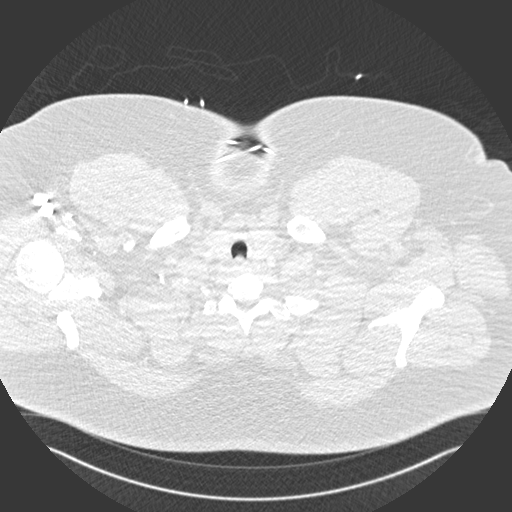

[18 of 36 positions shown; findings below may reference images not displayed]

FINDINGS: Cardiovascular: The study is low quality for the evaluation of
pulmonary embolism, limited by motion and poor bolus opacification.
There are no convincing filling defects in the central, lobar,
segmental or subsegmental pulmonary artery branches to suggest acute
pulmonary embolism. Normal course and caliber of the thoracic aorta.
Top-normal caliber main pulmonary artery (3.1 cm diameter).
Top-normal heart size. No significant pericardial fluid/thickening.

Mediastinum/Nodes: No discrete thyroid nodules. Unremarkable
esophagus. No pathologically enlarged axillary, mediastinal or hilar
lymph nodes.

Lungs/Pleura: No pneumothorax. No pleural effusion. No acute
consolidative airspace disease, lung masses or significant pulmonary
nodules.

Upper abdomen: Postsurgical changes from sleeve gastrectomy.

Musculoskeletal: No aggressive appearing focal osseous lesions. Mild
thoracic spondylosis.

Review of the MIP images confirms the above findings.
IMPRESSION: Limited scan. No evidence of pulmonary embolism. No active pulmonary
disease.
